# Patient Record
Sex: Female | Born: 1960 | Hispanic: Yes | Marital: Married | Smoking: Never smoker
Health system: Southern US, Community
[De-identification: ages and names within clinical notes are randomized; demographics above are authoritative.]

## PROBLEM LIST (undated history)

## (undated) ENCOUNTER — Ambulatory Visit: Admission: EM | Source: Home / Self Care

## (undated) DIAGNOSIS — M1990 Unspecified osteoarthritis, unspecified site: Secondary | ICD-10-CM

## (undated) HISTORY — PX: BREAST BIOPSY: SHX20

## (undated) HISTORY — PX: INDUCED ABORTION: SHX677

---

## 2005-02-26 ENCOUNTER — Ambulatory Visit: Payer: Self-pay | Admitting: Family Medicine

## 2005-04-25 ENCOUNTER — Ambulatory Visit: Payer: Self-pay | Admitting: Internal Medicine

## 2006-04-03 ENCOUNTER — Ambulatory Visit: Payer: Self-pay | Admitting: Family Medicine

## 2006-08-21 ENCOUNTER — Ambulatory Visit: Payer: Self-pay | Admitting: Certified Nurse Midwife

## 2006-09-18 ENCOUNTER — Ambulatory Visit: Payer: Self-pay | Admitting: General Surgery

## 2008-02-26 ENCOUNTER — Ambulatory Visit: Payer: Self-pay

## 2008-12-16 ENCOUNTER — Ambulatory Visit: Payer: Self-pay

## 2011-07-02 ENCOUNTER — Ambulatory Visit: Payer: Self-pay

## 2012-07-09 ENCOUNTER — Ambulatory Visit: Payer: Self-pay

## 2012-07-15 ENCOUNTER — Ambulatory Visit: Payer: Self-pay

## 2012-09-01 ENCOUNTER — Ambulatory Visit: Payer: Self-pay | Admitting: Surgery

## 2012-09-03 LAB — PATHOLOGY REPORT

## 2016-09-17 HISTORY — PX: BREAST BIOPSY: SHX20

## 2017-03-13 DIAGNOSIS — E782 Mixed hyperlipidemia: Secondary | ICD-10-CM | POA: Insufficient documentation

## 2017-03-13 DIAGNOSIS — M0579 Rheumatoid arthritis with rheumatoid factor of multiple sites without organ or systems involvement: Secondary | ICD-10-CM | POA: Insufficient documentation

## 2017-05-22 ENCOUNTER — Other Ambulatory Visit: Payer: Self-pay | Admitting: Certified Nurse Midwife

## 2017-05-22 DIAGNOSIS — Z1231 Encounter for screening mammogram for malignant neoplasm of breast: Secondary | ICD-10-CM

## 2017-07-02 ENCOUNTER — Ambulatory Visit
Admission: RE | Admit: 2017-07-02 | Discharge: 2017-07-02 | Disposition: A | Discharge status: Home / Self Care | Payer: BLUE CROSS/BLUE SHIELD | Source: Ambulatory Visit | Attending: Certified Nurse Midwife | Admitting: Certified Nurse Midwife

## 2017-07-02 DIAGNOSIS — Z1231 Encounter for screening mammogram for malignant neoplasm of breast: Secondary | ICD-10-CM | POA: Insufficient documentation

## 2018-02-07 ENCOUNTER — Ambulatory Visit
Admission: EM | Admit: 2018-02-07 | Discharge: 2018-02-07 | Disposition: A | Discharge status: Home / Self Care | Payer: BLUE CROSS/BLUE SHIELD

## 2018-02-07 ENCOUNTER — Encounter: Payer: Self-pay | Admitting: Emergency Medicine

## 2018-02-07 ENCOUNTER — Other Ambulatory Visit: Payer: Self-pay

## 2018-02-07 DIAGNOSIS — J0100 Acute maxillary sinusitis, unspecified: Secondary | ICD-10-CM | POA: Diagnosis not present

## 2018-02-07 DIAGNOSIS — M5412 Radiculopathy, cervical region: Secondary | ICD-10-CM | POA: Diagnosis not present

## 2018-02-07 HISTORY — DX: Unspecified osteoarthritis, unspecified site: M19.90

## 2018-02-07 MED ORDER — METAXALONE 800 MG PO TABS
800.0000 mg | ORAL_TABLET | Freq: Three times a day (TID) | ORAL | 0 refills | Status: DC
Start: 2018-02-07 — End: 2019-01-27

## 2018-02-07 MED ORDER — AMOXICILLIN-POT CLAVULANATE 875-125 MG PO TABS: 1.0000 | ORAL_TABLET | Freq: Two times a day (BID) | ORAL | 0 refills | Status: DC

## 2018-02-07 NOTE — ED Provider Notes (Signed)
[facility]    CSN: [id number] Arrival date & time: 02/07/18  1510     History   Chief Complaint Chief Complaint  Patient presents with  . Neck Pain  . Sinus Problem    HPI Kathy Gould is a 57 y.o. female.   HPI  -year-old Hispanic female presents with neck and shoulder pain with radicular symptoms into her upper extremities bilateral but not involving her hands or fingers.  Her main reason for coming today however was because of sinus congestion headache nausea and cough.  He did last month at [facility] primary for an upper respiratory infection.  She was given fluticasone which she is been using 2 sprays twice daily.  She states it is not helped.  She has pain in the maxillary sinus area.  All told she has had the pain for about 3 weeks.          Past Medical History:  Diagnosis Date  . Arthritis     There are no active problems to display for this patient.   Past Surgical History:  Procedure Laterality Date  . BREAST BIOPSY Left 09/17/2016   left bx BENIGN FIBROEPITHELIAL LESION, FAVOR FIBROADENOMA WITH USUAL   . BREAST BIOPSY Right    2 bx clips     OB History   None      Home Medications    Prior to Admission medications   Medication Sig Start Date End Date Taking? Authorizing Provider  sulfaSALAzine (AZULFIDINE) 500 MG tablet Take 1,500 mg by mouth 2 (two) times daily. 01/20/18  Yes [provider]  amoxicillin-clavulanate (AUGMENTIN) 875-125 MG tablet Take 1 tablet by mouth every 12 (twelve) hours. 02/07/18   Lutricia Feil, PA-C  metaxalone (SKELAXIN) 800 MG tablet Take 1 tablet (800 mg total) by mouth 3 (three) times daily. 02/07/18   Lutricia Feil, PA-C    Family History History reviewed. No pertinent family history.  Social History Social History   Tobacco Use  . Smoking status: Never Smoker  . Smokeless tobacco: Never Used  Substance Use Topics  . Alcohol use: Never    Frequency: Never  . Drug use:  Never     Allergies   Patient has no allergy information on record.   Review of Systems Review of Systems  Constitutional: Positive for activity change and fatigue. Negative for chills and fever.  HENT: Positive for congestion, postnasal drip, rhinorrhea, sinus pressure and sinus pain.   Musculoskeletal: Positive for arthralgias, myalgias and neck pain.  All other systems reviewed and are negative.    Physical Exam Triage Vital Signs ED Triage Vitals  Enc Vitals Group     BP 02/07/18 1622 124/77     Pulse Rate 02/07/18 1622 73     Resp 02/07/18 1622 16     Temp 02/07/18 1622 98.3 F (36.8 C)     Temp Source 02/07/18 1622 Oral     SpO2 02/07/18 1622 97 %     Weight 02/07/18 1619 138 lb (62.6 kg)     Height 02/07/18 1619 5\' 2"  (1.575 m)     Head Circumference --      Peak Flow --      Pain Score 02/07/18 1618 7     Pain Loc --      Pain Edu? --      Excl. in GC? --    No data found.  Updated Vital Signs BP 124/77 (BP Location: Right Arm)   Pulse 73  Temp 98.3 F (36.8 C) (Oral)   Resp 16   Ht 5\' 2"  (1.575 m)   Wt 138 lb (62.6 kg)   SpO2 97%   BMI 25.24 kg/m   Visual Acuity Right Eye Distance:   Left Eye Distance:   Bilateral Distance:    Right Eye Near:   Left Eye Near:    Bilateral Near:     Physical Exam  Constitutional: She is oriented to person, place, and time. She appears well-developed and well-nourished. No distress.  HENT:  Head: Normocephalic.  Right Ear: External ear normal.  Left Ear: External ear normal.  Nose: Nose normal.  Mouth/Throat: Oropharynx is clear and moist. No oropharyngeal exudate.  She has tenderness to percussion over the maxillary sinuses  Eyes: Pupils are equal, round, and reactive to light. Right eye exhibits no discharge. Left eye exhibits no discharge.  Neck: Normal range of motion.  Pulmonary/Chest: Effort normal and breath sounds normal.  Musculoskeletal: Normal range of motion. She exhibits tenderness.  Has  tenderness and tightness of the trapezii bilaterally reproducing her symptoms  Lymphadenopathy:    She has no cervical adenopathy.  Neurological: She is alert and oriented to person, place, and time.  Skin: Skin is warm and dry. She is not diaphoretic.  Psychiatric: She has a normal mood and affect. Her behavior is normal. Judgment and thought content normal.  Nursing note and vitals reviewed.    UC Treatments / Results  Labs (all labs ordered are listed, but only abnormal results are displayed) Labs Reviewed - No data to display  EKG None  Radiology No results found.  Procedures Procedures (including critical care time)  Medications Ordered in UC Medications - No data to display  Initial Impression / Assessment and Plan / UC Course  I have reviewed the triage vital signs and the nursing notes.  Pertinent labs & imaging results that were available during my care of the patient were reviewed by me and considered in my medical decision making (see chart for details).     Plan: 1. Test/x-ray results and diagnosis reviewed with patient 2. rx as per orders; risks, benefits, potential side effects reviewed with patient 3. Recommend supportive treatment with decreasing Flonase to 1 time daily.  The length of time that she has had her symptoms as well as the tenderness over the maxillary sinuses I will start her on Augmentin.  Trapezial tenderness and the radicular symptoms that she is been having I will start her on Skelaxin.  She is cautioned regarding the use of muscle relaxers .  Is not improving she should follow-up with her primary care physician at [facility] primary 4. F/u prn if symptoms worsen or don't improve  Final Clinical Impressions(s) / UC Diagnoses   Final diagnoses:  Cervical radiculopathy  Acute maxillary sinusitis, recurrence not specified     Discharge Instructions     Decrease the use of Flonase to once daily.Use  Caution while taking muscle relaxers.  Do not  perform activities requiring concentration or judgment and do not drive.     ED Prescriptions    Medication Sig Dispense Auth. Provider   metaxalone (SKELAXIN) 800 MG tablet Take 1 tablet (800 mg total) by mouth 3 (three) times daily. 21 tablet [facility], PA-C   amoxicillin-clavulanate (AUGMENTIN) 875-125 MG tablet Take 1 tablet by mouth every 12 (twelve) hours. 20 tablet Ovid Curd, PA-C     Controlled Substance Prescriptions  Controlled Substance Registry consulted? Not Applicable   Lutricia Feil,  Chrissie Noa, PA-C 02/07/18 1709

## 2018-02-07 NOTE — ED Triage Notes (Signed)
Pt c/o neck and shoulder pain. Also has sinus congestion, headache, nausea and cough. She was treated with a antibiotic but she can not remember the name but she is not feeling any better. She has been taking tylenol for the pain.

## 2018-02-07 NOTE — Discharge Instructions (Signed)
Decrease the use of Flonase to once daily.Use  Caution while taking muscle relaxers.  Do not perform activities requiring concentration or judgment and do not drive.

## 2018-06-11 ENCOUNTER — Other Ambulatory Visit: Payer: Self-pay | Admitting: Certified Nurse Midwife

## 2018-06-11 DIAGNOSIS — Z1231 Encounter for screening mammogram for malignant neoplasm of breast: Secondary | ICD-10-CM

## 2019-01-27 ENCOUNTER — Other Ambulatory Visit: Payer: Self-pay

## 2019-01-27 ENCOUNTER — Ambulatory Visit
Admission: EM | Admit: 2019-01-27 | Discharge: 2019-01-27 | Disposition: A | Discharge status: Home / Self Care | Payer: BC Managed Care – PPO

## 2019-01-27 DIAGNOSIS — L237 Allergic contact dermatitis due to plants, except food: Secondary | ICD-10-CM | POA: Diagnosis not present

## 2019-01-27 MED ORDER — DIPHENHYDRAMINE-ZINC ACETATE 2-0.1 % EX CREA: 1.0000 "application " | TOPICAL_CREAM | Freq: Three times a day (TID) | CUTANEOUS | 0 refills | Status: DC | PRN

## 2019-01-27 MED ORDER — PREDNISONE 10 MG (21) PO TBPK: ORAL_TABLET | Freq: Every day | ORAL | 0 refills | Status: DC

## 2019-01-27 NOTE — ED Triage Notes (Signed)
Patient complains of poison oak that started on Saturday while cutting the grass, originally on arm but has spread to face.

## 2019-01-27 NOTE — ED Provider Notes (Signed)
[place], [place]   Name: Manuella Blackson DOB: [birth year] MRN: [id number] CSN: [id number] PCP: Patient, No Pcp Per  Arrival date and time:  01/27/19 1007  Chief Complaint:  Poison Ivy   NOTE: Prior to seeing the patient today, I have reviewed the triage nursing documentation and vital signs. Clinical staff has updated patient's PMH/PSHx, current medication list, and drug allergies/intolerances to ensure comprehensive history available to assist in medical decision making.   History:   HPI: Jalisa Kari Baars is a 58 y.o. female who presents today with complaints of rash to her above eyes, arms, check, and LEFT flank that initially declared 3 days ago. Rash started on her arms and has progressively spread. Patient advising that she was working outside and inadvertently came into contact with poison oak. Rash is erythematous and pruritic. She has not appreciated any drainage associated with the rash. There is facial involvement; rash above BILATERAL eyes. There is no rash to periorbital, paranasal, or perioral areas. Patient denies that she is not experiencing any shortness of breath or sensation of pharyngeal/laryngeal fullness. Despite her symptoms, patient has not used any OTC interventions to help reduce/relieve the associated pruritis.  Past Medical History:  Diagnosis Date  . Arthritis     Past Surgical History:  Procedure Laterality Date  . BREAST BIOPSY Left 09/17/2016   left bx BENIGN FIBROEPITHELIAL LESION, FAVOR FIBROADENOMA WITH USUAL   . BREAST BIOPSY Right    2 bx clips     History reviewed. No pertinent family history.  Social History   Tobacco Use  . Smoking status: Never Smoker  . Smokeless tobacco: Never Used  Substance Use Topics  . Alcohol use: Never    Frequency: Never  . Drug use: Never    There are no active problems to display for this patient.   Home Medications:    Current Meds  Medication Sig  . sulfaSALAzine (AZULFIDINE) 500 MG  tablet Take by mouth.    Allergies:   Patient has no known allergies.  Review of Systems (ROS): Review of Systems  Constitutional: Negative for chills and fever.  HENT: Negative for drooling and trouble swallowing.   Respiratory: Negative for cough and shortness of breath.   Cardiovascular: Negative for chest pain and palpitations.  Skin: Positive for color change and rash.  All other systems reviewed and are negative.    Vital Signs: Today's Vitals   01/27/19 1018 01/27/19 1021  BP:  128/72  Pulse:  73  Resp:  16  Temp:  98.3 F (36.8 C)  TempSrc:  Oral  SpO2:  96%  Weight: 148 lb (67.1 kg)   Height: 5\' 6"  (1.676 m)   PainSc: 0-No pain     Physical Exam: Physical Exam  Constitutional: She is oriented to person, place, and time and well-developed, well-nourished, and in no distress.  HENT:  Head: Normocephalic and atraumatic.  Mouth/Throat: Oropharynx is clear and moist and mucous membranes are normal. No posterior oropharyngeal edema or posterior oropharyngeal erythema.  Neck: Normal range of motion. Neck supple. Tracheal deviation present.  Cardiovascular: Normal rate, regular rhythm, normal heart sounds and intact distal pulses. Exam reveals no gallop and no friction rub.  No murmur heard. Pulmonary/Chest: Effort normal and breath sounds normal. No respiratory distress. She has no wheezes. She has no rales.  Lymphadenopathy:    She has no cervical adenopathy.  Neurological: She is alert and oriented to person, place, and time. Gait normal. GCS score is 15.  Skin: Skin is  warm and dry. Rash noted. Rash is maculopapular (above eyes, arms, chest, LEFT flank).  Psychiatric: Mood, memory, affect and judgment normal.  Nursing note and vitals reviewed.   Urgent Care Treatments / Results:   LABS: PLEASE NOTE: all labs that were ordered this encounter are listed, however only abnormal results are displayed. Labs Reviewed - No data to display  EKG: -None  RADIOLOGY:  No results found.  PROCEDURES: Procedures  MEDICATIONS RECEIVED THIS VISIT: Medications - No data to display  PERTINENT CLINICAL COURSE NOTES/UPDATES:   Initial Impression / Assessment and Plan / Urgent Care Course:  Pertinent labs & imaging results that were available during my care of the patient were personally reviewed by me and considered in my medical decision making (see lab/imaging section of note for values and interpretations).  Meeka [place] Alita Chyle is a 58 y.o. female who presents to [facility] today with complaints of rash related to exposure to poison oak.   Patient is well appearing overall in clinic today. She does not appear to be in any acute distress. Presenting symptoms (see HPI) and exam as documented above. Presenting symptoms consistent with reported exposure to poison oak. She denies any shortness of breath or difficulties swallowing. Will prescribe a steroid taper and encouraged use of oral diphenhydramine at night. Patient will be given topical diphenhydramine to use during the day.   Discussed follow up with primary care physician in 1 week for re-evaluation. I have reviewed the follow up and strict return precautions for any new or worsening symptoms. Patient is aware of symptoms that would be deemed urgent/emergent, and would thus require further evaluation either here or in the emergency department. At the time of discharge, she verbalized understanding and consent with the discharge plan as it was reviewed with her. All questions were fielded by provider and/or clinic staff prior to patient discharge.    Final Clinical Impressions / Urgent Care Diagnoses:   Final diagnoses:  Contact dermatitis due to poison oak    New Prescriptions:  [place] Controlled Substance Registry consulted? Not Applicable  Meds ordered this encounter  Medications  . predniSONE (STERAPRED UNI-PAK 21 TAB) 10 MG (21) TBPK tablet    Sig: Take by mouth daily. 60 mg x 1 day, 50 mg  x 1 day, 40 mg x 1 day, 30 mg x 1 day, 20 mg x 1 day, 10 mg x 1 day    Dispense:  21 tablet    Refill:  0  . diphenhydrAMINE-zinc acetate (BENADRYL EXTRA STRENGTH) cream    Sig: Apply 1 application topically 3 (three) times daily as needed for itching.    Dispense:  28.4 g    Refill:  0    Recommended Follow up Care:  Patient encouraged to follow up with the following provider within the specified time frame, or sooner as dictated by the severity of her symptoms. As always, she was instructed that for any urgent/emergent care needs, she should seek care either here or in the emergency department for more immediate evaluation.  Follow-up Information    PCP In 1 week.   Why: General reassessment of symptoms if not improving        NOTE: This note was prepared using Scientist, clinical (histocompatibility and immunogenetics) along with smaller Lobbyist. Despite my best ability to proofread, there is the potential that transcriptional errors may still occur from this process, and are completely unintentional.    Verlee Monte, NP 01/27/19 1046

## 2019-01-27 NOTE — Discharge Instructions (Signed)
It was very nice seeing you today in clinic. Thank you for entrusting me with your care.   Avoid scratching.  Please utilize the medications that we e discussed. Your prescriptions have been called in to your pharmacy.  May take oral Benadryl at night to help with itching During the day, you can use the Benadryl cream that I sent in for you.   Make arrangements to follow up with your regular doctor in 1 week for re-evaluation if not improving. If your symptoms/condition worsens, please seek follow up care either here or in the ER. Please remember, our [facility] providers are "right here with you" when you need [place].   Again, it was my pleasure to take care of you today. Thank you for choosing our clinic. I hope that you start to feel better quickly.   Honor Loh, MSN, APRN, FNP-C, CEN Advanced Practice Provider [facility]

## 2019-06-30 ENCOUNTER — Other Ambulatory Visit: Payer: Self-pay | Admitting: Certified Nurse Midwife

## 2019-06-30 DIAGNOSIS — Z1231 Encounter for screening mammogram for malignant neoplasm of breast: Secondary | ICD-10-CM

## 2019-09-09 IMAGING — MG MM DIGITAL SCREENING BILAT W/ CAD
5 series · 5 of 5 positions shown · non-contrast
Comparison: Previous exam(s).

CLINICAL DATA: Screening.

EXAM:
DIGITAL SCREENING BILATERAL MAMMOGRAM WITH CAD

[R CC]
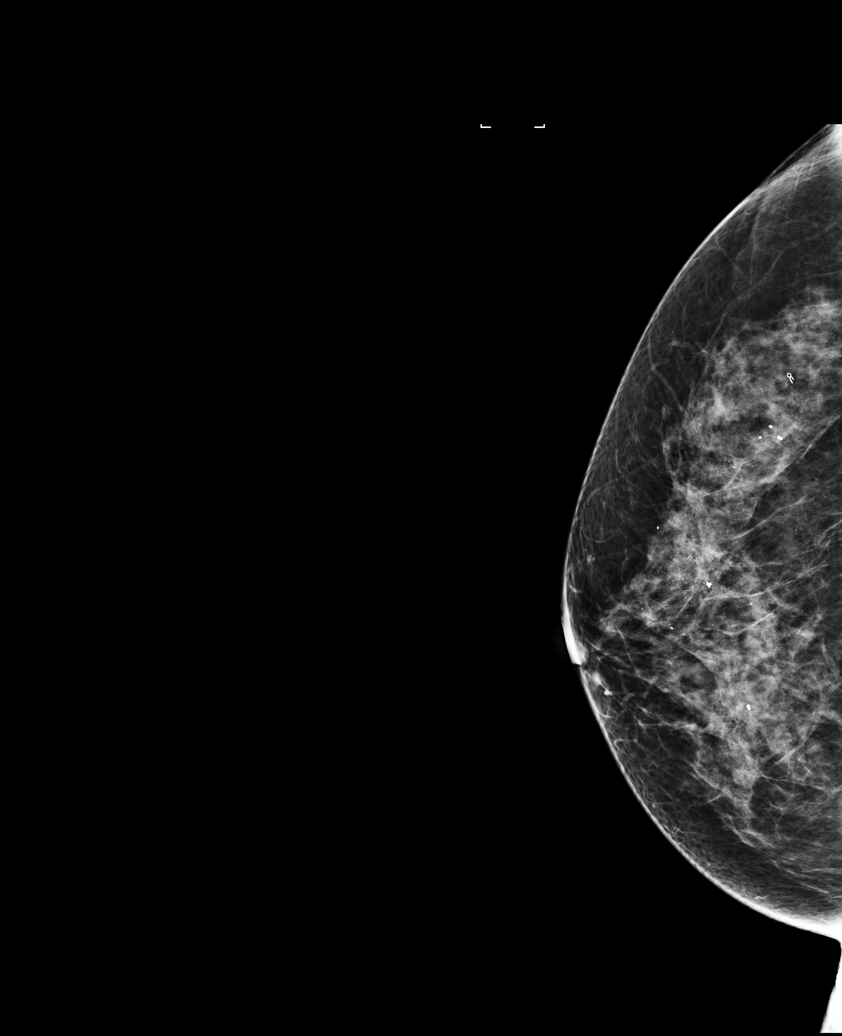

[R MLO]
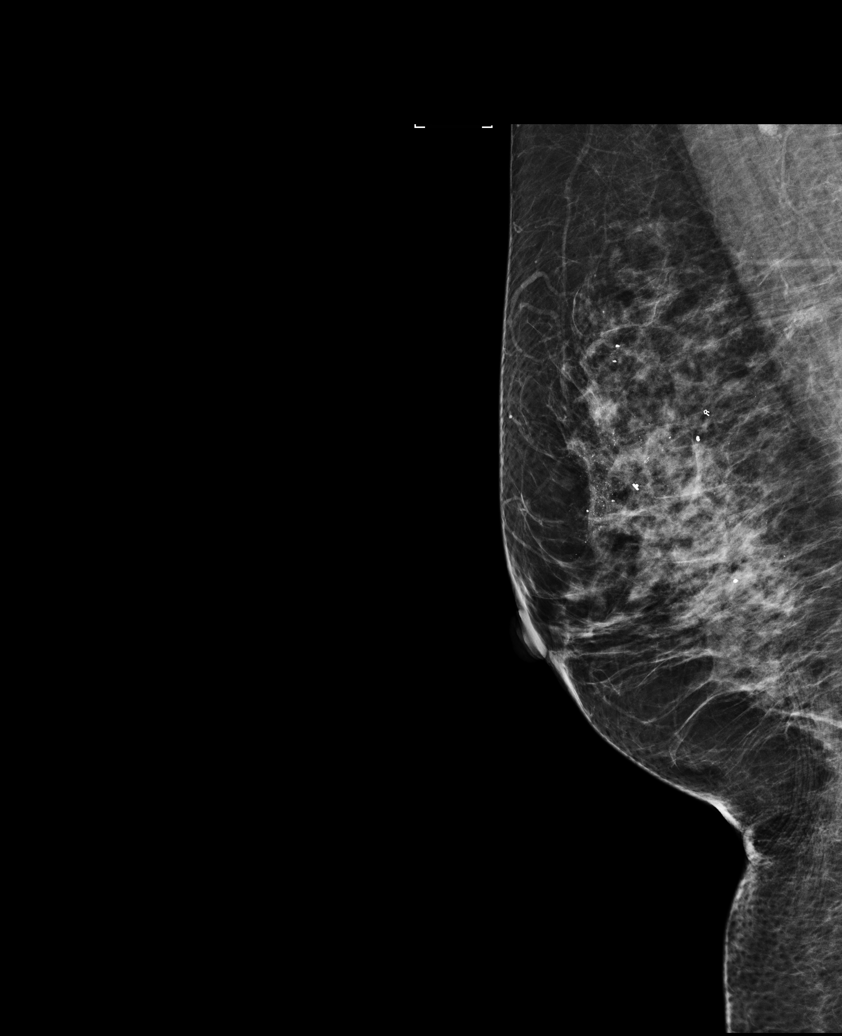

[L CC]
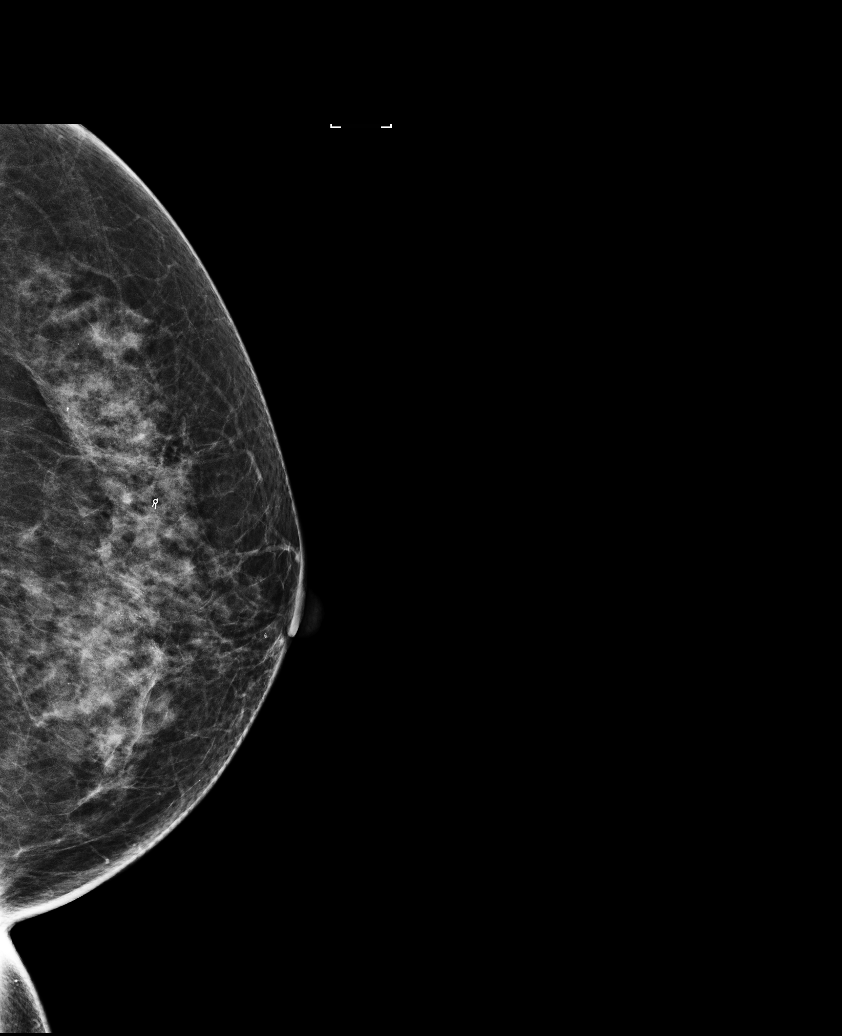

[L MLO (1 of 2)]
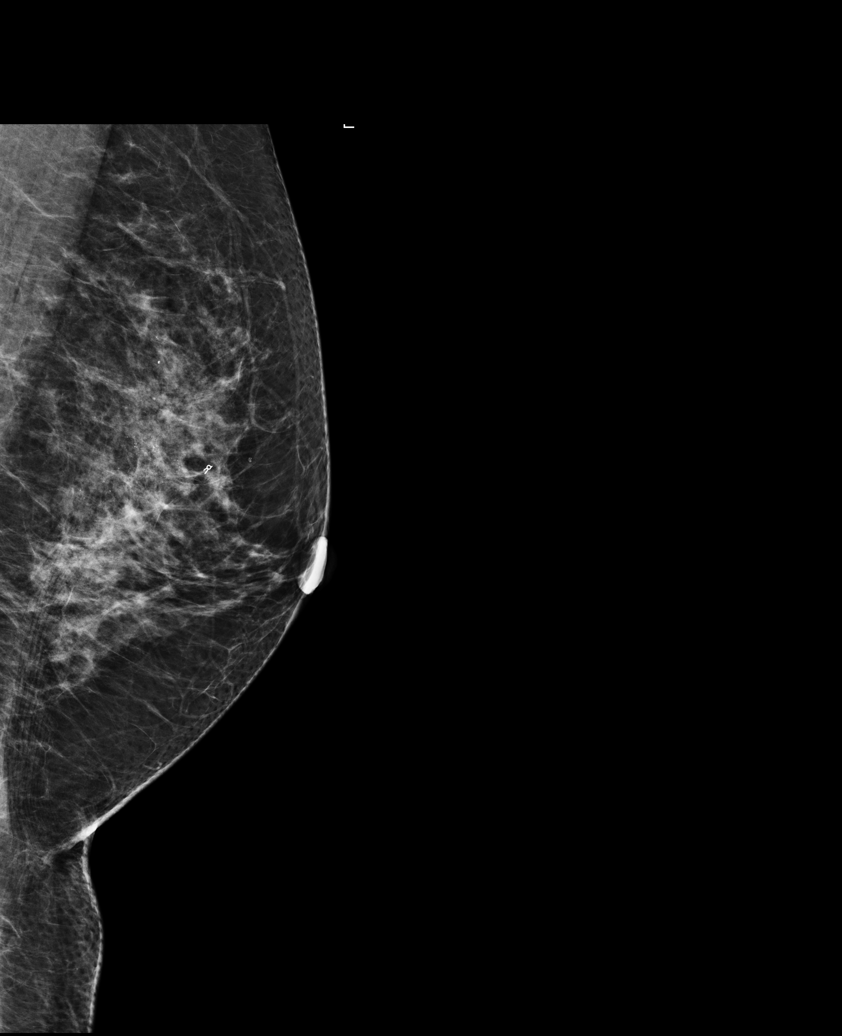

[L MLO (2 of 2)]
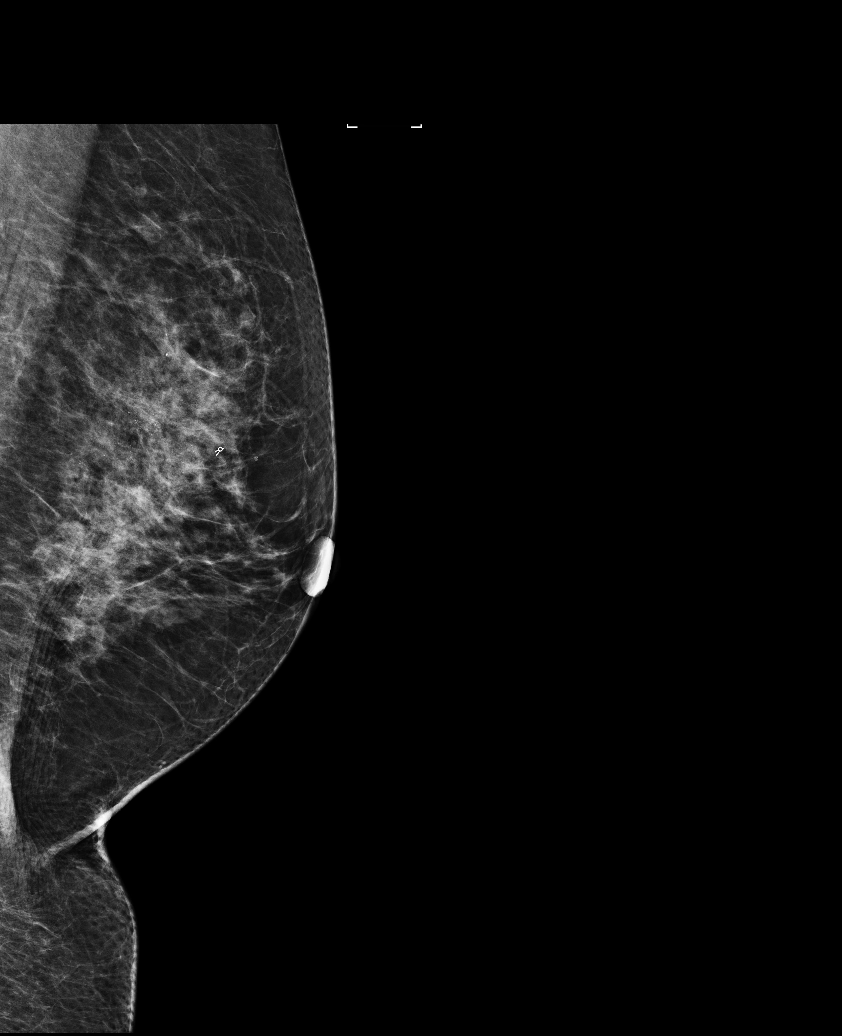

[5 of 5 positions shown; findings below may reference images not displayed]

ACR Breast Density Category c: The breast tissue is heterogeneously
dense, which may obscure small masses.
FINDINGS: There are no findings suspicious for malignancy. Images were
processed with CAD.
IMPRESSION: No mammographic evidence of malignancy. A result letter of this
screening mammogram will be mailed directly to the patient.

RECOMMENDATION:
Screening mammogram in one year. (Code:[id number])

BI-RADS CATEGORY  1: Negative.

## 2019-09-11 ENCOUNTER — Ambulatory Visit: Payer: Self-pay | Attending: Oncology

## 2019-09-11 DIAGNOSIS — Z23 Encounter for immunization: Secondary | ICD-10-CM

## 2019-09-11 NOTE — Progress Notes (Signed)
   Covid-19 Vaccination Clinic  Name:  Kathy Gould    MRN: [id number] DOB: [birth year]  09/11/2019  Ms. Kathy Gould was observed post Covid-19 immunization for 15 minutes without incident. She was provided with Vaccine Information Sheet and instruction to access the V-Safe system.   Ms. Kathy Gould was instructed to call 911 with any severe reactions post vaccine: Marland Kitchen Difficulty breathing  . Swelling of face and throat  . A fast heartbeat  . A bad rash all over body  . Dizziness and weakness   Immunizations Administered    Name Date Dose VIS Date Route   Pfizer COVID-19 Vaccine 09/11/2019 10:00 AM 0.3 mL 05/15/2019 Intramuscular   Manufacturer: [employer], [employer]   Lot: [id number]   NDC: [address]-4

## 2019-10-06 ENCOUNTER — Ambulatory Visit: Payer: Self-pay | Attending: Internal Medicine

## 2019-10-06 DIAGNOSIS — Z23 Encounter for immunization: Secondary | ICD-10-CM

## 2019-10-06 NOTE — Progress Notes (Signed)
   Covid-19 Vaccination Clinic  Name:  Kathy Gould    MRN: [id number] DOB: 20-Nov-1960  10/06/2019  Kathy Gould was observed post Covid-19 immunization for 15 minutes without incident. She was provided with Vaccine Information Sheet and instruction to access the V-Safe system.   Kathy Gould was instructed to call 911 with any severe reactions post vaccine: Marland Kitchen Difficulty breathing  . Swelling of face and throat  . A fast heartbeat  . A bad rash all over body  . Dizziness and weakness   Immunizations Administered    Name Date Dose VIS Date Route   Pfizer COVID-19 Vaccine 10/06/2019  3:53 PM 0.3 mL 07/29/2018 Intramuscular   Manufacturer: [employer], [employer]   Lot: [id number]   NDC: [address]-2

## 2019-11-21 ENCOUNTER — Other Ambulatory Visit: Payer: Self-pay

## 2019-11-21 ENCOUNTER — Ambulatory Visit
Admission: EM | Admit: 2019-11-21 | Discharge: 2019-11-21 | Disposition: A | Discharge status: Home / Self Care | Payer: Self-pay | Attending: Internal Medicine | Admitting: Internal Medicine

## 2019-11-21 DIAGNOSIS — L239 Allergic contact dermatitis, unspecified cause: Secondary | ICD-10-CM

## 2019-11-21 MED ORDER — PREDNISONE 20 MG PO TABS: 20.0000 mg | ORAL_TABLET | Freq: Every day | ORAL | 0 refills | Status: AC

## 2019-11-21 MED ORDER — HYDROXYZINE HCL 25 MG PO TABS: 25.0000 mg | ORAL_TABLET | Freq: Four times a day (QID) | ORAL | 0 refills | Status: DC

## 2019-11-21 NOTE — ED Triage Notes (Signed)
Patient complains of left side of face swelling x Thursday. Patient states that area is slightly itchy with redness.

## 2019-11-22 NOTE — ED Provider Notes (Signed)
[facility]    CSN: [id number] Arrival date & time: 11/21/19  1227      History   Chief Complaint Chief Complaint  Patient presents with  . Facial Swelling    left side    HPI Kathy Gould is a 59 y.o. female comes to the urgent care with 1 day history of itchy swelling in the left infraorbital area.  Symptoms started 3 days ago.  She has been doing some yard work and has poison ivy in the area backyard.  She believes that she is coming to contact with some poison ivy.  She also has some of the itchy rash on the left upper extremity.  No fever or chills.  No problems with her vision.  No blurry vision or pain on moving eyeball.   HPI  Past Medical History:  Diagnosis Date  . Arthritis     There are no problems to display for this patient.   Past Surgical History:  Procedure Laterality Date  . BREAST BIOPSY Left 09/17/2016   left bx BENIGN FIBROEPITHELIAL LESION, FAVOR FIBROADENOMA WITH USUAL   . BREAST BIOPSY Right    2 bx clips     OB History   No obstetric history on file.      Home Medications    Prior to Admission medications   Medication Sig Start Date End Date Taking? Authorizing Provider  sulfaSALAzine (AZULFIDINE) 500 MG tablet Take by mouth. 03/20/18  Yes [provider]  hydrOXYzine (ATARAX/VISTARIL) 25 MG tablet Take 1 tablet (25 mg total) by mouth every 6 (six) hours. 11/21/19   Tavi Hoogendoorn, Britta Mccreedy, MD  predniSONE (DELTASONE) 20 MG tablet Take 1 tablet (20 mg total) by mouth daily for 3 days. 11/21/19 11/24/19  Merrilee Jansky, MD    Family History History reviewed. No pertinent family history.  Social History Social History   Tobacco Use  . Smoking status: Never Smoker  . Smokeless tobacco: Never Used  Vaping Use  . Vaping Use: Never used  Substance Use Topics  . Alcohol use: Never  . Drug use: Never     Allergies   Patient has no known allergies.   Review of Systems Review of Systems    Constitutional: Negative.   HENT: Negative.   Eyes: Negative for photophobia, pain, discharge, redness, itching and visual disturbance.  Respiratory: Negative.   Cardiovascular: Negative.   Gastrointestinal: Negative.   Musculoskeletal: Negative.      Physical Exam Triage Vital Signs ED Triage Vitals  Enc Vitals Group     BP 11/21/19 1248 (!) 124/57     Pulse Rate 11/21/19 1248 77     Resp 11/21/19 1248 18     Temp 11/21/19 1248 98.3 F (36.8 C)     Temp Source 11/21/19 1248 Oral     SpO2 11/21/19 1248 97 %     Weight 11/21/19 1247 146 lb (66.2 kg)     Height 11/21/19 1247 5\' 6"  (1.676 m)     Head Circumference --      Peak Flow --      Pain Score 11/21/19 1246 0     Pain Loc --      Pain Edu? --      Excl. in GC? --    No data found.  Updated Vital Signs BP (!) 124/57 (BP Location: Right Arm)   Pulse 77   Temp 98.3 F (36.8 C) (Oral)   Resp 18   Ht 5\' 6"  (1.676 m)  Wt 66.2 kg   SpO2 97%   BMI 23.57 kg/m   Visual Acuity Right Eye Distance:   Left Eye Distance:   Bilateral Distance:    Right Eye Near:   Left Eye Near:    Bilateral Near:     Physical Exam Vitals and nursing note reviewed.  Constitutional:      General: She is not in acute distress.    Appearance: She is not ill-appearing.  Eyes:     Extraocular Movements: Extraocular movements intact.     Pupils: Pupils are equal, round, and reactive to light.     Comments: Mildly erythematous rash with excoriation over the left lower eyelid.  Wrist mild swelling over the area.  No conjunctival erythema.  Left upper eyelid is without any swelling, excoriation or erythema.  Pulmonary:     Effort: Pulmonary effort is normal.     Breath sounds: Normal breath sounds.  Skin:    General: Skin is warm.     Capillary Refill: Capillary refill takes less than 2 seconds.     Findings: No erythema or lesion.  Neurological:     Mental Status: She is alert.      UC Treatments / Results  Labs (all labs  ordered are listed, but only abnormal results are displayed) Labs Reviewed - No data to display  EKG   Radiology No results found.  Procedures Procedures (including critical care time)  Medications Ordered in UC Medications - No data to display  Initial Impression / Assessment and Plan / UC Course  I have reviewed the triage vital signs and the nursing notes.  Pertinent labs & imaging results that were available during my care of the patient were reviewed by me and considered in my medical decision making (see chart for details).     1.  Allergic contact dermatitis: Short course of prednisone Hydroxyzine as needed for itching Continue calamine lotion use If symptoms worsen, return to urgent care to be reevaluated If you notice any changes in your vision-return to urgent care to be reevaluated. Final Clinical Impressions(s) / UC Diagnoses   Final diagnoses:  Allergic contact dermatitis, unspecified trigger   Discharge Instructions   None    ED Prescriptions    Medication Sig Dispense Auth. Provider   predniSONE (DELTASONE) 20 MG tablet Take 1 tablet (20 mg total) by mouth daily for 3 days. 3 tablet Edessa Jakubowicz, Myrene Galas, MD   hydrOXYzine (ATARAX/VISTARIL) 25 MG tablet Take 1 tablet (25 mg total) by mouth every 6 (six) hours. 12 tablet Elodia Haviland, Myrene Galas, MD     PDMP not reviewed this encounter.   Chase Picket, MD 11/22/19 [phone]

## 2020-07-02 ENCOUNTER — Other Ambulatory Visit: Payer: Self-pay

## 2020-07-02 ENCOUNTER — Ambulatory Visit
Admission: EM | Admit: 2020-07-02 | Discharge: 2020-07-02 | Disposition: A | Discharge status: Home / Self Care | Payer: HRSA Program | Attending: Family Medicine | Admitting: Family Medicine

## 2020-07-02 DIAGNOSIS — B9789 Other viral agents as the cause of diseases classified elsewhere: Secondary | ICD-10-CM

## 2020-07-02 DIAGNOSIS — U071 COVID-19: Secondary | ICD-10-CM | POA: Diagnosis not present

## 2020-07-02 LAB — GROUP A STREP BY PCR: Group A Strep by PCR: NOT DETECTED

## 2020-07-02 MED ORDER — LIDOCAINE VISCOUS HCL 2 % MT SOLN: OROMUCOSAL | 0 refills | Status: DC

## 2020-07-02 MED ORDER — BENZONATATE 200 MG PO CAPS: 200.0000 mg | ORAL_CAPSULE | Freq: Three times a day (TID) | ORAL | 0 refills | Status: DC | PRN

## 2020-07-02 NOTE — Discharge Instructions (Signed)
Medication as prescribed.  Stay home.  Check my chart for COVID test results.  Take care  Dr. Jaxsyn Catalfamo   

## 2020-07-02 NOTE — ED Triage Notes (Signed)
Patient c/o cough and sore throat that started yesterday.  Patient unsure about fevers.

## 2020-07-03 LAB — SARS CORONAVIRUS 2 (TAT 6-24 HRS): SARS Coronavirus 2: POSITIVE — AB

## 2020-07-03 NOTE — ED Provider Notes (Signed)
[facility]    CSN: [id number] Arrival date & time: 07/02/20  1553      History   Chief Complaint Chief Complaint  Patient presents with  . Sore Throat  . Cough   HPI 60 year old female presents with the above complaints.  Started yesterday.  Reports cough and sore throat.  No reported fever.  Pain 7/10 in severity.  No relieving factors.  Needs Covid testing today.  No other complaints or concerns at this time.   Past Medical History:  Diagnosis Date  . Arthritis    Past Surgical History:  Procedure Laterality Date  . BREAST BIOPSY Left 09/17/2016   left bx BENIGN FIBROEPITHELIAL LESION, FAVOR FIBROADENOMA WITH USUAL   . BREAST BIOPSY Right    2 bx clips     OB History   No obstetric history on file.      Home Medications    Prior to Admission medications   Medication Sig Start Date End Date Taking? Authorizing Provider  benzonatate (TESSALON) 200 MG capsule Take 1 capsule (200 mg total) by mouth 3 (three) times daily as needed for cough. 07/02/20  Yes Blayde Bacigalupi G, DO  lidocaine (XYLOCAINE) 2 % solution Gargle 15 mL every 3 hours as needed. May swallow if desired. 07/02/20  Yes Tommie Sams, DO  sulfaSALAzine (AZULFIDINE) 500 MG tablet Take by mouth. 03/20/18  Yes [provider]    Social History Social History   Tobacco Use  . Smoking status: Never Smoker  . Smokeless tobacco: Never Used  Vaping Use  . Vaping Use: Never used  Substance Use Topics  . Alcohol use: Never  . Drug use: Never     Allergies   Patient has no known allergies.   Review of Systems Review of Systems  Constitutional: Negative for fever.  HENT: Positive for sore throat.   Respiratory: Positive for cough.    Physical Exam Triage Vital Signs ED Triage Vitals  Enc Vitals Group     BP 07/02/20 1601 134/64     Pulse Rate 07/02/20 1601 92     Resp 07/02/20 1601 14     Temp 07/02/20 1601 98.2 F (36.8 C)     Temp Source 07/02/20 1601 Oral     SpO2  07/02/20 1601 97 %     Weight 07/02/20 1558 145 lb 15.1 oz (66.2 kg)     Height 07/02/20 1558 5\' 4"  (1.626 m)     Head Circumference --      Peak Flow --      Pain Score 07/02/20 1557 7     Pain Loc --      Pain Edu? --      Excl. in GC? --    Updated Vital Signs BP 134/64 (BP Location: Right Arm)   Pulse 92   Temp 98.2 F (36.8 C) (Oral)   Resp 14   Ht 5\' 4"  (1.626 m)   Wt 66.2 kg   SpO2 97%   BMI 25.05 kg/m   Visual Acuity Right Eye Distance:   Left Eye Distance:   Bilateral Distance:    Right Eye Near:   Left Eye Near:    Bilateral Near:     Physical Exam Vitals and nursing note reviewed.  Constitutional:      General: She is not in acute distress.    Appearance: Normal appearance. She is not ill-appearing.  HENT:     Head: Normocephalic and atraumatic.     Mouth/Throat:  Pharynx: Posterior oropharyngeal erythema present. No oropharyngeal exudate.  Cardiovascular:     Rate and Rhythm: Normal rate and regular rhythm.     Heart sounds: No murmur heard.   Pulmonary:     Effort: Pulmonary effort is normal.     Breath sounds: Normal breath sounds. No wheezing, rhonchi or rales.  Neurological:     Mental Status: She is alert.  Psychiatric:        Mood and Affect: Mood normal.        Behavior: Behavior normal.    UC Treatments / Results  Labs (all labs ordered are listed, but only abnormal results are displayed) Labs Reviewed  SARS CORONAVIRUS 2 (TAT 6-24 HRS) - Abnormal; Notable for the following components:      Result Value   SARS Coronavirus 2 POSITIVE (*)    All other components within normal limits  GROUP A STREP BY PCR    EKG   Radiology No results found.  Procedures Procedures (including critical care time)  Medications Ordered in UC Medications - No data to display  Initial Impression / Assessment and Plan / UC Course  I have reviewed the triage vital signs and the nursing notes.  Pertinent labs & imaging results that were  available during my care of the patient were reviewed by me and considered in my medical decision making (see chart for details).    60 year old female presents with viral respiratory infection.  Covid positive.  Tessalon Perles and viscous lidocaine for symptomatic relief.  Work note given.  Final Clinical Impressions(s) / UC Diagnoses   Final diagnoses:  COVID     Discharge Instructions     Medication as prescribed.  Stay home.  Check my chart for COVID test results.  Take care  Dr. Adriana Simas      ED Prescriptions    Medication Sig Dispense Auth. Provider   lidocaine (XYLOCAINE) 2 % solution Gargle 15 mL every 3 hours as needed. May swallow if desired. 200 mL Deshonna Trnka G, DO   benzonatate (TESSALON) 200 MG capsule Take 1 capsule (200 mg total) by mouth 3 (three) times daily as needed for cough. 30 capsule Tommie Sams, DO     PDMP not reviewed this encounter.   Everlene Other [place], [place] 07/03/20 [phone]

## 2021-06-22 ENCOUNTER — Ambulatory Visit: Payer: BC Managed Care – PPO | Attending: Infectious Diseases | Admitting: Infectious Diseases

## 2021-06-22 ENCOUNTER — Other Ambulatory Visit: Payer: Self-pay

## 2021-06-22 VITALS — BP 112/75 | HR 74 | Temp 98.2°F | Resp 16 | Ht 64.0 in | Wt 145.0 lb

## 2021-06-22 DIAGNOSIS — E785 Hyperlipidemia, unspecified: Secondary | ICD-10-CM | POA: Insufficient documentation

## 2021-06-22 DIAGNOSIS — M069 Rheumatoid arthritis, unspecified: Secondary | ICD-10-CM | POA: Diagnosis not present

## 2021-06-22 DIAGNOSIS — Z227 Latent tuberculosis: Secondary | ICD-10-CM | POA: Diagnosis not present

## 2021-06-22 DIAGNOSIS — Z8611 Personal history of tuberculosis: Secondary | ICD-10-CM | POA: Diagnosis present

## 2021-06-22 DIAGNOSIS — R7612 Nonspecific reaction to cell mediated immunity measurement of gamma interferon antigen response without active tuberculosis: Secondary | ICD-10-CM | POA: Diagnosis not present

## 2021-06-22 DIAGNOSIS — Z791 Long term (current) use of non-steroidal anti-inflammatories (NSAID): Secondary | ICD-10-CM | POA: Diagnosis not present

## 2021-06-22 NOTE — Patient Instructions (Addendum)
You are referred to me for positive quantiferon gold- you have a history of TB exposure at the age of 19yr in [place] and you were treated you say .  I will try to get the records from Health department- I will go check your CXR You may need another PPD, which I will decide after getting these records-

## 2021-06-22 NOTE — Progress Notes (Signed)
NAME: Kathy Gould  DOB: 07-11-1960  MRN: [id number]  Date/Time: 06/22/2021 9:06 AM   Subjective:   Kathy Gould is a 61 y.o. with a history rheumatoid arthritis, HLD is referred by Dr.Kernodle for positive quantiferon gold- She has rheumatoid arthritis with positive anti CCP antibodies. She is currently on sulfasalazine and the plan is to add methotrexate and a biologic later- As part of the work up Quantiferon gold was done to look for TB exposure and it came back positive . Pt says she  Came from [place] to [place] and had TB exposure and was treated 40 yrs ago She has no cough, sob, night sweats, fever, weight loss She works at  [employer]  Past Medical History:  Diagnosis Date   Arthritis    Rheumatoid arthritis Hyperlipidemia  Past Surgical History:  Procedure Laterality Date   BREAST BIOPSY Left 09/17/2016   left bx BENIGN FIBROEPITHELIAL LESION, FAVOR FIBROADENOMA WITH USUAL    BREAST BIOPSY Right    2 bx clips     Social History   Socioeconomic History   Marital status: Married    Spouse name: Not on file   Number of children: Not on file   Years of education: Not on file   Highest education level: Not on file  Occupational History   Not on file  Tobacco Use   Smoking status: Never   Smokeless tobacco: Never  Vaping Use   Vaping Use: Never used  Substance and Sexual Activity   Alcohol use: Never   Drug use: Never   Sexual activity: Not on file  Other Topics Concern   Not on file  Social History Narrative   Not on file   Social Determinants of Health   Financial Resource Strain: Not on file  Food Insecurity: Not on file  Transportation Needs: Not on file  Physical Activity: Not on file  Stress: Not on file  Social Connections: Not on file  Intimate Partner Violence: Not on file     No Known Allergies I?  Medication'sulfasalazine  FH- none she says   Abtx:  Anti-infectives (From admission, onward)    None        REVIEW OF SYSTEMS:  Const: negative fever, negative chills, negative weight loss Eyes: negative diplopia or visual changes, negative eye pain ENT: negative coryza, negative sore throat Resp: negative cough, hemoptysis, dyspnea Cards: negative for chest pain, palpitations, lower extremity edema GU: negative for frequency, dysuria and hematuria GI: Negative for abdominal pain, diarrhea, bleeding, constipation Skin: negative for rash and pruritus Heme: negative for easy bruising and gum/nose bleeding MS: no pain in her hands- may have some stiffness but she says much beterr with sulfasalazine  Neurolo:negative for headaches, dizziness, vertigo, memory problems  Psych: negative for feelings of anxiety, depression  Endocrine: negative for thyroid, diabetes Allergy/Immunology- negative for any medication or food allergies ? Pertinent Positives include : Objective:  VITALS:  BP 112/75    Pulse 74    Temp 98.2 F (36.8 C) (Oral)    Resp 16    Ht '5\' 4"'  (1.626 m)    Wt 145 lb (65.8 kg)    SpO2 94%    BMI 24.89 kg/m   PHYSICAL EXAM:  General: Alert, cooperative, no distress, appears stated age.  Head: Normocephalic, without obvious abnormality, atraumatic. Eyes: Conjunctivae clear, anicteric sclerae. Pupils are equal ENT Nares normal. No drainage or sinus tenderness. Lips, mucosa, and tongue normal. No Thrush Neck: Supple, symmetrical, no  adenopathy, thyroid: non tender no carotid bruit and no JVD. Back: No CVA tenderness. Lungs: Clear to auscultation bilaterally. No Wheezing or Rhonchi. No rales. Heart: Regular rate and rhythm, no murmur, rub or gallop. Abdomen: Soft, non-tender,not distended. Bowel sounds normal. No masses Extremities: atraumatic, no cyanosis. No edema. No clubbing Skin: No rashes or lesions. Or bruising Lymph: Cervical, supraclavicular normal. Neurologic: Grossly non-focal Pertinent Labs Lab Results 05/15/21- HB 12.6, WBC 4.7, PLT 230, MCV 90 NA 141, K  3.8, cr 0.6, AST 23, ALT29, ALK 91, Alb 4.1, ESR 21 CCP -59   IMAGING RESULTS: None available ? 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 ?plan is to start MTX /biologics as per Dr.Kernodle ? ___________________________________________________ Discussed with patient, requesting provider Note:  This document was prepared using Dragon voice recognition software and may include unintentional dictation errors.

## 2021-06-27 ENCOUNTER — Telehealth: Payer: Self-pay

## 2021-06-27 NOTE — Telephone Encounter (Signed)
Called to discuss TB treatment and testing records that were done over 40 years ago at [facility], [facility]).  TB clinic [phone] [place] [address]   Med Records [phone] Auerbach     Both departments informed me they are legally bound to keep records for only 7 years Medical and 10 years Behavioral and then they destroy them.

## 2021-07-12 ENCOUNTER — Telehealth: Payer: Self-pay

## 2021-07-12 NOTE — Telephone Encounter (Signed)
I spoke to the patient with Kathy Gould and advise her that we were unable to obtain her medical records from [place] due to the amount of years for the records. Patient advised she will need PPD test CXR and sputum test per Dr. Rivka Safer. Patient verbalized understanding. Patient scheduled for 07/20/21.  Thanks   Interpreter: Mario/ID [phone]

## 2021-07-20 ENCOUNTER — Ambulatory Visit: Payer: BC Managed Care – PPO | Attending: Infectious Diseases | Admitting: Infectious Diseases

## 2021-07-20 ENCOUNTER — Ambulatory Visit
Admission: RE | Admit: 2021-07-20 | Discharge: 2021-07-20 | Disposition: A | Discharge status: Home / Self Care | Payer: BC Managed Care – PPO | Source: Ambulatory Visit | Attending: Infectious Diseases | Admitting: Infectious Diseases

## 2021-07-20 ENCOUNTER — Other Ambulatory Visit: Payer: Self-pay

## 2021-07-20 ENCOUNTER — Ambulatory Visit
Admission: RE | Admit: 2021-07-20 | Discharge: 2021-07-20 | Disposition: A | Discharge status: Home / Self Care | Payer: BC Managed Care – PPO | Attending: Infectious Diseases | Admitting: Infectious Diseases

## 2021-07-20 VITALS — BP 137/82 | HR 70 | Temp 98.0°F

## 2021-07-20 DIAGNOSIS — Z79899 Other long term (current) drug therapy: Secondary | ICD-10-CM | POA: Insufficient documentation

## 2021-07-20 DIAGNOSIS — Z227 Latent tuberculosis: Secondary | ICD-10-CM | POA: Diagnosis not present

## 2021-07-20 DIAGNOSIS — M069 Rheumatoid arthritis, unspecified: Secondary | ICD-10-CM | POA: Diagnosis not present

## 2021-07-20 DIAGNOSIS — E785 Hyperlipidemia, unspecified: Secondary | ICD-10-CM | POA: Diagnosis not present

## 2021-07-20 NOTE — Progress Notes (Signed)
NAME: Kathy Gould  DOB: 09-27-1960  MRN: [id number]  Date/Time: 07/20/2021 11:44 AM   Subjective:  Follow up visit , I last saw her on 06/22/21 Today she is here to discuss further about the positive quantiferon gold. Spanish interpretor is in the room Following is taken from last note [name] is a 61 y.o. with a history rheumatoid arthritis, HLD is referred by Dr.Kernodle for positive quantiferon gold- She has rheumatoid arthritis with positive anti CCP antibodies. She is currently on sulfasalazine and the plan is to add methotrexate and a biologic later- As part of the work up, Quantiferon gold was done to look for TB exposure ,and it came back positive . Pt says she came from [place] to [place] and had TB exposure and was treated 40 yrs ago.  [place] [facility] [place] did not have records from 40 yrs ago   Pt has no cough, sob, night sweats, fever, weight loss She is currently taking sulfasalazine and says she has no pain in her fingers/hands  Past Medical History:  Diagnosis Date   Arthritis    Rheumatoid arthritis Hyperlipidemia  Past Surgical History:  Procedure Laterality Date   BREAST BIOPSY Left 09/17/2016   left bx BENIGN FIBROEPITHELIAL LESION, FAVOR FIBROADENOMA WITH USUAL    BREAST BIOPSY Right    2 bx clips     Social History   Socioeconomic History   Marital status: Married    Spouse name: Not on file   Number of children: Not on file   Years of education: Not on file   Highest education level: Not on file  Occupational History   Not on file  Tobacco Use   Smoking status: Never   Smokeless tobacco: Never  Vaping Use   Vaping Use: Never used  Substance and Sexual Activity   Alcohol use: Never   Drug use: Never   Sexual activity: Not on file  Other Topics Concern   Not on file  Social History Narrative   Not on file   Social Determinants of Health   Financial Resource Strain: Not on file   Food Insecurity: Not on file  Transportation Needs: Not on file  Physical Activity: Not on file  Stress: Not on file  Social Connections: Not on file  Intimate Partner Violence: Not on file     No Known Allergies I?  Medication'sulfasalazine  FH- none she says   Abtx:  Anti-infectives (From admission, onward)    None       REVIEW OF SYSTEMS:  Const: negative fever, negative chills, negative weight loss Eyes: negative diplopia or visual changes, negative eye pain ENT: negative coryza, negative sore throat Resp: negative cough, hemoptysis, dyspnea Cards: negative for chest pain, palpitations, lower extremity edema GU: negative for frequency, dysuria and hematuria GI: Negative for abdominal pain, diarrhea, bleeding, constipation Skin: negative for rash and pruritus Heme: negative for easy bruising and gum/nose bleeding MS: no pain in her hands- may have some stiffness but she says much bettter with sulfasalazine  Neurolo:negative for headaches, dizziness, vertigo, memory problems  Psych: negative for feelings of anxiety, depression  Endocrine: negative for thyroid, diabetes Allergy/Immunology- negative for any medication or food allergies ? Objective:  VITALS:  BP 137/82    Pulse 70    Temp 98 F (36.7 C) (Oral)   PHYSICAL EXAM:  General: looks well Head: Normocephalic, without obvious abnormality, atraumatic. Eyes: Conjunctivae clear, anicteric sclerae. Pupils are equal ENT Nares normal.  No drainage or sinus tenderness. Lips, mucosa, and tongue normal. No Thrush Neck: Supple, symmetrical, no adenopathy, thyroid: non tender no carotid bruit and no JVD. Back: No CVA tenderness. Lungs: Clear to auscultation bilaterally. No Wheezing or Rhonchi. No rales. Heart: Regular rate and rhythm, no murmur, rub or gallop. Abdomen: Soft, non-tender,not distended. Bowel sounds normal. No masses Extremities: som ulnar deviation of fingers  Skin: No rashes or lesions. Or  bruising Lymph: Cervical, supraclavicular normal. Neurologic: Grossly non-focal Pertinent Labs Lab Results 05/15/21- HB 12.6, WBC 4.7, PLT 230, MCV 90 NA 141, K 3.8, cr 0.6, AST 23, ALT29, ALK 91, Alb 4.1, ESR 21 CCP -59   IMAGING RESULTS:  ?CXR- no obvious infiltrate or cavity- await report from radiologist Impression/Recommendation Positive quantiferon Gold With h/o TB exposure when 61 yrs old Apparently took treatment given by HD dan[facility] [place] to get old records [place] [facility] does not keep records for 40 years  Rheumatoid arthritis- on sulfasalazine? ?plan is to start MTX /biologics as per Dr.Kernodle If that is the case then will treat with rifampin for 4 months before that. she says sulfasalzaine is working and thinks she wont need biologics. She is very apprehensive to start rifampin and wants to wait-  CXR PA/LAT today . Will discuss with Dr.Kernodle-  Discussed with patient in detail Follow PRN Note:  This document was prepared using Dragon voice recognition software and may include unintentional dictation errors.

## 2021-07-20 NOTE — Patient Instructions (Signed)
Today we will do CXR and then I will talk with Dr.Kernodle . As you are apprehensive to start Rifampin we will wait till the need for biologics ( injections)

## 2021-11-24 DIAGNOSIS — M0579 Rheumatoid arthritis with rheumatoid factor of multiple sites without organ or systems involvement: Secondary | ICD-10-CM | POA: Diagnosis not present

## 2021-11-24 DIAGNOSIS — Z79899 Other long term (current) drug therapy: Secondary | ICD-10-CM | POA: Diagnosis not present

## 2022-03-06 DIAGNOSIS — Z79899 Other long term (current) drug therapy: Secondary | ICD-10-CM | POA: Diagnosis not present

## 2022-03-06 DIAGNOSIS — M0579 Rheumatoid arthritis with rheumatoid factor of multiple sites without organ or systems involvement: Secondary | ICD-10-CM | POA: Diagnosis not present

## 2022-04-20 ENCOUNTER — Other Ambulatory Visit: Payer: Self-pay | Admitting: Infectious Diseases

## 2022-04-20 ENCOUNTER — Other Ambulatory Visit: Payer: Self-pay | Admitting: Certified Nurse Midwife

## 2022-04-20 DIAGNOSIS — Z1231 Encounter for screening mammogram for malignant neoplasm of breast: Secondary | ICD-10-CM

## 2022-04-24 ENCOUNTER — Ambulatory Visit
Admission: RE | Admit: 2022-04-24 | Discharge: 2022-04-24 | Disposition: A | Discharge status: Home / Self Care | Payer: BC Managed Care – PPO | Source: Ambulatory Visit | Attending: Certified Nurse Midwife | Admitting: Certified Nurse Midwife

## 2022-04-24 DIAGNOSIS — Z1231 Encounter for screening mammogram for malignant neoplasm of breast: Secondary | ICD-10-CM | POA: Diagnosis not present

## 2022-11-23 DIAGNOSIS — M0579 Rheumatoid arthritis with rheumatoid factor of multiple sites without organ or systems involvement: Secondary | ICD-10-CM | POA: Diagnosis not present

## 2022-11-23 DIAGNOSIS — Z79899 Other long term (current) drug therapy: Secondary | ICD-10-CM | POA: Diagnosis not present

## 2022-12-17 ENCOUNTER — Ambulatory Visit
Admission: EM | Admit: 2022-12-17 | Discharge: 2022-12-17 | Disposition: A | Discharge status: Nursing Home (Includes ICF) | Payer: BC Managed Care – PPO | Attending: Urgent Care | Admitting: Urgent Care

## 2022-12-17 ENCOUNTER — Other Ambulatory Visit: Payer: Self-pay

## 2022-12-17 DIAGNOSIS — J069 Acute upper respiratory infection, unspecified: Secondary | ICD-10-CM | POA: Insufficient documentation

## 2022-12-17 LAB — GROUP A STREP BY PCR: Group A Strep by PCR: NOT DETECTED

## 2022-12-17 NOTE — ED Provider Notes (Signed)
[facility]    CSN: [id number] Arrival date & time: 12/17/22  4098      History   Chief Complaint Chief Complaint  Patient presents with   Sore Throat    HPI Kathy Gould is a 62 y.o. female.    Sore Throat  Presents with 3-day history of sore throat.  She also endorses nasal congestion with yellow discharge and productive cough with yellow discharge.  Denies fever.  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      Home Medications    Prior to Admission medications   Medication Sig Start Date End Date Taking? Authorizing Provider  sulfaSALAzine (AZULFIDINE) 500 MG tablet Take by mouth. 03/20/18   [provider]    Family History Family History  Problem Relation Age of Onset   Breast cancer Neg Hx     Social History Social History   Tobacco Use   Smoking status: Never   Smokeless tobacco: Never  Vaping Use   Vaping status: Never Used  Substance Use Topics   Alcohol use: Never   Drug use: Never     Allergies   Patient has no known allergies.   Review of Systems Review of Systems   Physical Exam Triage Vital Signs ED Triage Vitals  Encounter Vitals Group     BP 12/17/22 1039 114/68     Systolic BP Percentile --      Diastolic BP Percentile --      Pulse Rate 12/17/22 1039 80     Resp 12/17/22 1039 18     Temp 12/17/22 1039 98.5 F (36.9 C)     Temp Source 12/17/22 1039 Oral     SpO2 12/17/22 1039 97 %     Weight --      Height --      Head Circumference --      Peak Flow --      Pain Score 12/17/22 1037 0     Pain  Loc --      Pain Education --      Exclude from Growth Chart --    No data found.  Updated Vital Signs BP 114/68 (BP Location: Left Arm)   Pulse 80   Temp 98.5 F (36.9 C) (Oral)   Resp 18   SpO2 97%   Visual Acuity Right Eye Distance:   Left Eye Distance:   Bilateral Distance:    Right Eye Near:   Left Eye Near:    Bilateral Near:     Physical Exam Vitals reviewed.  Constitutional:      Appearance: She is well-developed. She is not ill-appearing.  HENT:     Right Ear: Tympanic membrane normal.     Left Ear: Tympanic membrane normal.     Nose: Congestion present.     Mouth/Throat:     Pharynx: No oropharyngeal exudate or posterior oropharyngeal erythema.     Tonsils: No tonsillar exudate.  Cardiovascular:     Rate and Rhythm: Normal rate.     Heart  sounds: Normal heart sounds.  Pulmonary:     Effort: Pulmonary effort is normal.     Breath sounds: Normal breath sounds.  Skin:    General: Skin is warm and dry.  Neurological:     General: No focal deficit present.     Mental Status: She is alert and oriented to person, place, and time.  Psychiatric:        Mood and Affect: Mood normal.        Behavior: Behavior normal.      UC Treatments / Results  Labs (all labs ordered are listed, but only abnormal results are displayed) Labs Reviewed  GROUP A STREP BY PCR    EKG   Radiology No results found.  Procedures Procedures (including critical care time)  Medications Ordered in UC Medications - No data to display  Initial Impression / Assessment and Plan / UC Course  I have reviewed the triage vital signs and the nursing notes.  Pertinent labs & imaging results that were available during my care of the patient were reviewed by me and considered in my medical decision making (see chart for details).   Kathy Gould Kathy Gould is a 62 y.o. female presenting with URI symptoms. Patient is afebrile without recent antipyretics, satting well on room air.  Overall is well appearing, well hydrated, without respiratory distress. Pulmonary exam is unremarkable.  Lungs CTAB without wheezing, rhonchi, rales. RRR without murmurs, rubs, gallops.  No or very mild pharyngeal erythema.  No peritonsillar exudate.  TMs are WNL bilaterally.  Reviewed relevant chart history.  PCR result is negative.  Patient's symptoms are consistent with an acute viral process.  Recommending supportive care and use of OTC medication for symptom control.  Counseled patient on potential for adverse effects with medications prescribed/recommended today, ER and return-to-clinic precautions discussed, patient verbalized understanding and agreement with care plan.  Final Clinical Impressions(s) / UC Diagnoses   Final diagnoses:  None   Discharge Instructions   None    ED Prescriptions   None    PDMP not reviewed this encounter.   Kathy Gould, [place] 12/17/22 1115

## 2022-12-17 NOTE — ED Triage Notes (Signed)
Pt is here with a sore throat for 3 days, pt has taken Tylenol to relieve discomfort.

## 2022-12-17 NOTE — Discharge Instructions (Signed)
The results of today's testing were negative.  Your symptoms are likely caused by a viral infection like a common cold.  As we discussed in clinic, we recommend supportive care with use of over-the-counter medication for symptom control.  Follow up here or with your primary care provider if your symptoms are worsening or not improving.

## 2023-09-26 ENCOUNTER — Ambulatory Visit: Payer: Self-pay

## 2023-09-26 NOTE — Telephone Encounter (Signed)
 Chief Complaint: Abdominal pain  Symptoms: near the hip, vaginal discomfort, brown discharge Frequency: Come and go  Pertinent Negatives: Patient denies fever, vaginal itching,  Disposition: [] ED /[] Urgent Care (no appt availability in office) / [x] Appointment(In office/virtual)/ []  [facility]/ [] Home Care/ [] Refused Recommended Disposition /[] [facility]/ []  Follow-up with PCP Additional Notes: Patient states she has had some abdominal pain that comes and goes along with vaginal discharge. Patient states she usually uses OTC treatment for yeast infections and it is helpful but it continues to come back. Care advice was given and patient has been scheduled for a new patient appointment tomorrow at [facility].  Using [employer] [id number] .  Copied from CRM [id number]. Topic: Clinical - Red Word Triage >> Sep 26, 2023 10:25 AM Elle L wrote: Red Word that prompted transfer to Nurse Triage: The patient was calling to establish care at [facility]. However, she informed me that she has had stomach pain, brown discharge, and is concerned that she has a vaginal infection. It started a month ago but it is worsening. We have interpreter ID [phone] on the line Reason for Disposition  Unusual vaginal discharge (e.g., bad smelling, yellow, green, or foamy-white)  Answer Assessment - Initial Assessment Questions 1. LOCATION: "Where does it hurt?"      Near the hip 2. RADIATION: "Does the pain shoot anywhere else?" (e.g., chest, back)     To the vagina  3. ONSET: "When did the pain begin?" (e.g., minutes, hours or days ago)      It's been a while  4. SUDDEN: "Gradual or sudden onset?"     Gradual  5. PATTERN "Does the pain come and go, or is it constant?"    - If it comes and goes: "How long does it last?" "Do you have pain now?"     (Note: Comes and goes means the pain is intermittent. It goes away completely between bouts.)    - If constant: "Is it getting  better, staying the same, or getting worse?"      (Note: Constant means the pain never goes away completely; most serious pain is constant and gets worse.)      Come and goes  6. SEVERITY: "How bad is the pain?"  (e.g., Scale 1-10; mild, moderate, or severe)    - MILD (1-3): Doesn't interfere with normal activities, abdomen soft and not tender to touch.     - MODERATE (4-7): Interferes with normal activities or awakens from sleep, abdomen tender to touch.     - SEVERE (8-10): Excruciating pain, doubled over, unable to do any normal activities.       Moderate  7. RECURRENT SYMPTOM: "Have you ever had this type of stomach pain before?" If Yes, ask: "When was the last time?" and "What happened that time?"      Yes, OTC treatment  8. CAUSE: "What do you think is causing the stomach pain?"     Vaginal infection  9. RELIEVING/AGGRAVATING FACTORS: "What makes it better or worse?" (e.g., antacids, bending or twisting motion, bowel movement)     No  10. OTHER SYMPTOMS: "Do you have any other symptoms?" (e.g., back pain, diarrhea, fever, urination pain, vomiting)       Brown vaginal discharge  11. PREGNANCY: "Is there any chance you are pregnant?" "When was your last menstrual period?"       no  Protocols used: Abdominal Pain - [facility]

## 2023-09-27 ENCOUNTER — Ambulatory Visit: Admitting: Student

## 2023-10-01 ENCOUNTER — Encounter: Payer: Self-pay | Admitting: Student

## 2023-10-01 ENCOUNTER — Ambulatory Visit: Admitting: Student

## 2023-10-01 VITALS — BP 122/76 | HR 98 | Ht 64.0 in | Wt 154.0 lb

## 2023-10-01 DIAGNOSIS — M0579 Rheumatoid arthritis with rheumatoid factor of multiple sites without organ or systems involvement: Secondary | ICD-10-CM

## 2023-10-01 DIAGNOSIS — R300 Dysuria: Secondary | ICD-10-CM | POA: Diagnosis not present

## 2023-10-01 DIAGNOSIS — E782 Mixed hyperlipidemia: Secondary | ICD-10-CM | POA: Diagnosis not present

## 2023-10-01 LAB — POCT URINALYSIS DIPSTICK
Bilirubin, UA: NEGATIVE
Blood, UA: NEGATIVE
Glucose, UA: NEGATIVE
Ketones, UA: NEGATIVE
Leukocytes, UA: NEGATIVE
Nitrite, UA: NEGATIVE
Protein, UA: NEGATIVE
Spec Grav, UA: >=1.03 — AB (ref 1.010–1.025)
Urobilinogen, UA: 0.2 U/dL
pH, UA: 6 (ref 5.0–8.0)

## 2023-10-01 NOTE — Assessment & Plan Note (Signed)
 Urine dipstick with increase specific gravity, will check kidney function, UA, and culture. Recommended increasing hydration.

## 2023-10-01 NOTE — Assessment & Plan Note (Signed)
 No active synovitis. Will check CBC and CMP as she has not had recent labs. She will make appointment with rheumatology

## 2023-10-01 NOTE — Progress Notes (Signed)
 New Patient Office Visit  Subjective    Patient ID: Kathy Gould, female    DOB: 04-28-1961  Age: 63 y.o. MRN: [id number]  CC:  Chief Complaint  Patient presents with   Establish Care   Urinary Tract Infection    X 2 days, unchanged, Burning, tried OTC medication , went away and came back, no discharge, no blood, vaginal itching     HPI Kathy Gould is a 63 year old person living with RA who presents to establish care  Dysuria Thinks she has Uti, reports burning with urination for the past 2 days also pain in the back and suprapubic region. Occasionally feels she is retaining urine after she holds urine when she does not have access to the bathroom. Feels she empties if she immediately void when she has the urge. Reports she drinks very little water due to not feeling thirsty. She denies fever, hematuria, vaginal discharge, vaginal rashes. She not sexually active and denies concern for STIs.   Rheumatoid arthritis She see Rheumatology at [facility] in [place]. Last appointment was in 11/23/2022. At fist doctor retired and the doctor wanted to follow up every 4 month but insurance does not cover. Not able to go was paying for her to go clearing Not having joint paint in hand and feet She will make appointment to she    Outpatient Encounter Medications as of 10/01/2023  Medication Sig   sulfaSALAzine (AZULFIDINE) 500 MG tablet Take 500 mg by mouth 2 (two) times daily.   [DISCONTINUED] sulfaSALAzine (AZULFIDINE) 500 MG tablet Take by mouth.   No facility-administered encounter medications on file as of 10/01/2023.    Past Medical History:  Diagnosis Date   Arthritis     Past Surgical History:  Procedure Laterality Date   BREAST BIOPSY Left 09/17/2016   left bx BENIGN FIBROEPITHELIAL LESION, FAVOR FIBROADENOMA WITH USUAL    BREAST BIOPSY Right    2 bx clips    INDUCED ABORTION      Family History  Problem Relation Age of Onset   Breast cancer  Neg Hx     Social History   Socioeconomic History   Marital status: Married    Spouse name: Not on file   Number of children: 0   Years of education: Not on file   Highest education level: Not on file  Occupational History   Not on file  Tobacco Use   Smoking status: Never   Smokeless tobacco: Never  Vaping Use   Vaping status: Never Used  Substance and Sexual Activity   Alcohol use: Never   Drug use: Never   Sexual activity: Yes  Other Topics Concern   Not on file  Social History Narrative   Not on file   Social Drivers of Health   Financial Resource Strain: Not on file  Food Insecurity: No Food Insecurity (10/01/2023)   Hunger Vital Sign    Worried About Running Out of Food in the Last Year: Never true    Ran Out of Food in the Last Year: Never true  Transportation Needs: No Transportation Needs (10/01/2023)   PRAPARE - Administrator, Civil Service (Medical): No    Lack of Transportation (Non-Medical): No  Physical Activity: Not on file  Stress: Not on file  Social Connections: Not on file  Intimate Partner Violence: Not At Risk (10/01/2023)   Humiliation, Afraid, Rape, and Kick questionnaire    Fear of Current or Ex-Partner: No  Emotionally Abused: No    Physically Abused: No    Sexually Abused: No    ROS Refer to HPI    Objective   BP 122/76   Pulse 98   Ht 5\' 4"  (1.626 m)   Wt 154 lb (69.9 kg)   SpO2 95%   BMI 26.43 kg/m   Physical Exam Constitutional:      Appearance: Normal appearance.  HENT:     Mouth/Throat:     Mouth: Mucous membranes are moist.     Pharynx: Oropharynx is clear.  Cardiovascular:     Rate and Rhythm: Normal rate and regular rhythm.  Pulmonary:     Effort: Pulmonary effort is normal.     Breath sounds: No rhonchi or rales.  Abdominal:     General: Abdomen is flat. Bowel sounds are normal. There is no distension.     Palpations: Abdomen is soft.     Tenderness: There is no right CVA tenderness or left CVA  tenderness.     Comments: Mild suprapubic pain with palpation  Musculoskeletal:        General: Normal range of motion.     Right lower leg: No edema.     Left lower leg: No edema.     Comments: No joint swelling or redness   Skin:    General: Skin is warm and dry.     Capillary Refill: Capillary refill takes less than 2 seconds.  Neurological:     General: No focal deficit present.     Mental Status: She is alert and oriented to person, place, and time.  Psychiatric:        Mood and Affect: Mood normal.        Behavior: Behavior normal.        10/01/2023    2:22 PM 07/20/2021   11:21 AM  Depression screen PHQ 2/9  Decreased Interest 0 0  Down, Depressed, Hopeless 0 0  PHQ - 2 Score 0 0      10/01/2023    2:22 PM  GAD 7 : Generalized Anxiety Score  Nervous, Anxious, on Edge 0  Control/stop worrying 0  Worry too much - different things 0  Trouble relaxing 0  Restless 0  Easily annoyed or irritable 0  Afraid - awful might happen 0  Total GAD 7 Score 0  Anxiety Difficulty Not difficult at all    Assessment & Plan:  Dysuria Assessment & Plan: Urine dipstick with increase specific gravity, will check kidney function, UA, and culture. Recommended increasing hydration.   Orders: -     POCT urinalysis dipstick -     Urine Culture -     Urinalysis, Routine w reflex microscopic  Rheumatoid arthritis involving multiple sites with positive rheumatoid factor (HCC) Assessment & Plan: No active synovitis. Will check CBC and CMP as she has not had recent labs. She will make appointment with rheumatology   Orders: -     CBC with Differential/Platelet -     Comprehensive metabolic panel with GFR  Hyperlipidemia, mixed Assessment & Plan: Check lipid panel  Orders: -     Lipid panel    Return in about 2 months (around 12/01/2023) for physical .   Barnetta Liberty, MD

## 2023-10-01 NOTE — Assessment & Plan Note (Signed)
 Check lipid panel

## 2023-10-02 ENCOUNTER — Other Ambulatory Visit: Payer: Self-pay | Admitting: Student

## 2023-10-02 LAB — COMPREHENSIVE METABOLIC PANEL WITH GFR
ALT: 45 IU/L — ABNORMAL HIGH (ref 0–32)
AST: 36 IU/L (ref 0–40)
Albumin: 4.1 g/dL (ref 3.9–4.9)
Alkaline Phosphatase: 125 IU/L — ABNORMAL HIGH (ref 44–121)
BUN/Creatinine Ratio: 35 — ABNORMAL HIGH (ref 12–28)
BUN: 19 mg/dL (ref 8–27)
Bilirubin Total: <0.2 mg/dL (ref 0.0–1.2)
CO2: 20 mmol/L (ref 20–29)
Calcium: 9.5 mg/dL (ref 8.7–10.3)
Chloride: 104 mmol/L (ref 96–106)
Creatinine, Ser: 0.54 mg/dL — ABNORMAL LOW (ref 0.57–1.00)
Globulin, Total: 2.3 g/dL (ref 1.5–4.5)
Glucose: 116 mg/dL — ABNORMAL HIGH (ref 70–99)
Potassium: 3.9 mmol/L (ref 3.5–5.2)
Sodium: 141 mmol/L (ref 134–144)
Total Protein: 6.4 g/dL (ref 6.0–8.5)
eGFR: 105 mL/min/{1.73_m2} (ref >59)

## 2023-10-02 LAB — LIPID PANEL
Chol/HDL Ratio: 4.1 ratio (ref 0.0–4.4)
Cholesterol, Total: 290 mg/dL — ABNORMAL HIGH (ref 100–199)
HDL: 71 mg/dL (ref >39)
LDL Chol Calc (NIH): 187 mg/dL — ABNORMAL HIGH (ref 0–99)
Triglycerides: 174 mg/dL — ABNORMAL HIGH (ref 0–149)
VLDL Cholesterol Cal: 32 mg/dL (ref 5–40)

## 2023-10-02 LAB — CBC WITH DIFFERENTIAL/PLATELET
Basophils Absolute: 0 10*3/uL (ref 0.0–0.2)
Basos: 0 %
EOS (ABSOLUTE): 0.1 10*3/uL (ref 0.0–0.4)
Eos: 2 %
Hematocrit: 42 % (ref 34.0–46.6)
Hemoglobin: 14 g/dL (ref 11.1–15.9)
Immature Grans (Abs): 0 10*3/uL (ref 0.0–0.1)
Immature Granulocytes: 1 %
Lymphocytes Absolute: 1.3 10*3/uL (ref 0.7–3.1)
Lymphs: 21 %
MCH: 29.7 pg (ref 26.6–33.0)
MCHC: 33.3 g/dL (ref 31.5–35.7)
MCV: 89 fL (ref 79–97)
Monocytes Absolute: 0.5 10*3/uL (ref 0.1–0.9)
Monocytes: 8 %
Neutrophils Absolute: 4.2 10*3/uL (ref 1.4–7.0)
Neutrophils: 68 %
Platelets: 256 10*3/uL (ref 150–450)
RBC: 4.72 x10E6/uL (ref 3.77–5.28)
RDW: 12.8 % (ref 11.7–15.4)
WBC: 6.2 10*3/uL (ref 3.4–10.8)

## 2023-10-02 LAB — URINALYSIS, ROUTINE W REFLEX MICROSCOPIC
Bilirubin, UA: NEGATIVE
Glucose, UA: NEGATIVE
Leukocytes,UA: NEGATIVE
Nitrite, UA: NEGATIVE
RBC, UA: NEGATIVE
Specific Gravity, UA: >=1.03 — AB (ref 1.005–1.030)
Urobilinogen, Ur: 1 mg/dL (ref 0.2–1.0)
pH, UA: 5.5 (ref 5.0–7.5)

## 2023-10-02 MED ORDER — ROSUVASTATIN CALCIUM 10 MG PO TABS: 10.0000 mg | ORAL_TABLET | Freq: Every day | ORAL | 3 refills | Status: DC

## 2023-10-03 LAB — URINE CULTURE

## 2023-10-03 NOTE — Progress Notes (Signed)
 Spoke with patient, discussed lab results and instructions per provider. Patient verbalized understanding. Patient mentioned that [facility] fill the medication rosuvastatin  (CRESTOR ) 10 mg for her and she picked it up. She would like to know if she should start taking the medication or hold off til next appointment?   I reviewed chart and it was ordered and then canceled by provider. Pleas advise?

## 2023-10-04 NOTE — Progress Notes (Signed)
 Spoke with patient. Informed her provider instructions of not taking the medication. Patient verbalized understanding.

## 2023-12-03 ENCOUNTER — Encounter: Payer: Self-pay | Admitting: Student

## 2023-12-03 ENCOUNTER — Encounter: Admitting: Student

## 2024-01-17 DIAGNOSIS — M19042 Primary osteoarthritis, left hand: Secondary | ICD-10-CM | POA: Diagnosis not present

## 2024-01-17 DIAGNOSIS — M19041 Primary osteoarthritis, right hand: Secondary | ICD-10-CM | POA: Diagnosis not present

## 2024-01-17 DIAGNOSIS — Z79899 Other long term (current) drug therapy: Secondary | ICD-10-CM | POA: Diagnosis not present

## 2024-01-17 DIAGNOSIS — M0579 Rheumatoid arthritis with rheumatoid factor of multiple sites without organ or systems involvement: Secondary | ICD-10-CM | POA: Diagnosis not present

## 2024-01-23 ENCOUNTER — Other Ambulatory Visit: Payer: Self-pay

## 2024-01-23 ENCOUNTER — Encounter: Payer: Self-pay | Admitting: *Deleted

## 2024-01-23 ENCOUNTER — Emergency Department
Admission: EM | Admit: 2024-01-23 | Discharge: 2024-01-24 | Disposition: A | Discharge status: Home / Self Care | Attending: Emergency Medicine | Admitting: Emergency Medicine

## 2024-01-23 DIAGNOSIS — W228XXA Striking against or struck by other objects, initial encounter: Secondary | ICD-10-CM | POA: Insufficient documentation

## 2024-01-23 DIAGNOSIS — Y990 Civilian activity done for income or pay: Secondary | ICD-10-CM | POA: Insufficient documentation

## 2024-01-23 DIAGNOSIS — S0592XA Unspecified injury of left eye and orbit, initial encounter: Secondary | ICD-10-CM | POA: Diagnosis not present

## 2024-01-23 DIAGNOSIS — S0502XA Injury of conjunctiva and corneal abrasion without foreign body, left eye, initial encounter: Secondary | ICD-10-CM | POA: Diagnosis not present

## 2024-01-23 MED ORDER — TETRACAINE HCL 0.5 % OP SOLN
1.0000 [drp] | Freq: Once | OPHTHALMIC | Status: AC
  Administered 2024-01-23: 1 [drp] via OPHTHALMIC
  Filled 2024-01-23: qty 4

## 2024-01-23 MED ORDER — ERYTHROMYCIN 5 MG/GM OP OINT
TOPICAL_OINTMENT | Freq: Once | OPHTHALMIC | Status: AC
  Administered 2024-01-23: 1 via OPHTHALMIC
  Filled 2024-01-23: qty 1

## 2024-01-23 MED ORDER — FLUORESCEIN SODIUM 1 MG OP STRP
1.0000 | ORAL_STRIP | Freq: Once | OPHTHALMIC | Status: AC
  Administered 2024-01-23: 1 via OPHTHALMIC
  Filled 2024-01-23: qty 1

## 2024-01-23 MED ORDER — OFLOXACIN 0.3 % OP SOLN
1.0000 [drp] | Freq: Four times a day (QID) | OPHTHALMIC | Status: DC
  Filled 2024-01-23: qty 5

## 2024-01-23 NOTE — ED Provider Notes (Signed)
 [facility] Provider Note    Event Date/Time   First MD Initiated Contact with Patient 01/23/24 2157     (approximate)   History   Eye Problem    HPI  Kathy Gould is a 63 y.o. female    with a past medical history of rheumatoid arthritis, latent TB, cervical radiculopathy, hepatitis B core antibody positive,  who presents to the ED complaining of left eye pain. According to the patient, today at work she was cutting a cart board box and hit her left eye.  Patient denies changes in vision.  Patient states having headache, and left eye pain.  Patient denies foreign body sensation in her eye.  Patient does not use contact lenses.     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   Rheumatoid arthritis involving multiple sites with positive rheumatoid factor (HCC) 03/13/2017   Hyperlipidemia, mixed 03/13/2017     ROS: Patient currently denies any vision changes, tinnitus, difficulty speaking, facial droop, sore throat, chest pain, shortness of breath, abdominal pain, nausea/vomiting/diarrhea, dysuria, or weakness/numbness/paresthesias in any extremity   Physical Exam   Triage Vital Signs: ED Triage Vitals  Encounter Vitals Group     BP 01/23/24 1929 121/62     Girls Systolic BP Percentile --      Girls Diastolic BP Percentile --      Boys Systolic BP Percentile --      Boys Diastolic BP Percentile --      Pulse Rate 01/23/24 1929 73     Resp 01/23/24 1929 18     Temp 01/23/24 1929 97.8 F (36.6 C)     Temp Source 01/23/24 1929 Oral     SpO2 01/23/24 1929 98 %     Weight 01/23/24 1927 154 lb (69.9 kg)     Height 01/23/24 1927 5' 4 (1.626 m)     Head Circumference --      Peak Flow --      Pain Score 01/23/24 1927 4     Pain Loc --      Pain Education --      Exclude from Growth Chart --     Most recent vital signs: Vitals:    01/23/24 1929  BP: 121/62  Pulse: 73  Resp: 18  Temp: 97.8 F (36.6 C)  SpO2: 98%     Physical Exam Vitals and nursing note reviewed.  During triage vital signs were normal  Constitutional:      General: Awake and alert. No acute distress.    Appearance: Normal appearance. The patient is normal weight.      Able to speak in complete sentences without cough or dyspnea  HENT:     Head: Normocephalic and atraumatic.     Mouth: Mucous membranes are moist.  Eyes:     General: PERRL. Normal EOMs          Conjunctiva/sclera: Conjunctivae normal.  Left eye: Conjunctiva no erythema at 5.  Nose No congestion/rhinorrhea  CV:                  Good peripheral perfusion.  Regular rate and rhythm  Resp:               Normal effort.  Equal breath sounds bilaterally.  Abd:  No distention.  Soft, nontender.  No rebound or guarding.  Musculoskeletal:        General: No swelling. Normal range of motion.  Skin:    General: Skin is warm and dry.     Capillary Refill: Capillary refill takes less than 2 seconds.     Findings: No rash.  Neurological:     Mental Status: The patient is awake and alert. MAE spontaneously. No gross focal neurologic deficits are appreciated.  Psychiatric Mood and affect are normal. Speech and behavior are normal.  ED Results / Procedures / Treatments   Labs (all labs ordered are listed, but only abnormal results are displayed) Labs Reviewed - No data to display   EKG     RADIOLOGY      PROCEDURES:  Critical Care performed:   .Foreign Body Removal  Date/Time: 01/23/2024 11:47 PM  Performed by: Janit Kast, PA-C Authorized by: Janit Kast, PA-C  Consent: Verbal consent obtained Consent given by: patient Patient understanding: patient states understanding of the procedure being performed Patient identity confirmed: verbally with patient Body area: eye Location details: left conjunctiva Anesthesia method:  tetracaine .  Anesthesia: Local Anesthetic: tetracaine  drops  Sedation: Patient sedated: no  Patient restrained: no Patient cooperative: yes Localization method: slit lamp Eye examined with fluorescein . Fluorescein  uptake. Corneal abrasion size: medium Corneal abrasion location: lateral No residual rust ring present. Dressing: antibiotic ointment Depth: superficial Complexity: simple Post-procedure assessment: No foreing body.     MEDICATIONS ORDERED IN ED: Medications  ofloxacin  (OCUFLOX ) 0.3 % ophthalmic solution 1 drop (has no administration in time range)  fluorescein  ophthalmic strip 1 strip (1 strip Left Eye Given 01/23/24 2342)  tetracaine  (PONTOCAINE) 0.5 % ophthalmic solution 1 drop (1 drop Left Eye Given 01/23/24 2342)  erythromycin  ophthalmic ointment (1 Application Left Eye Given 01/23/24 2342)      IMPRESSION / MDM / ASSESSMENT AND PLAN / ED COURSE  I reviewed the triage vital signs and the nursing notes.  Differential diagnosis includes, but is not limited to, corneal abrasion, corneal laceration, foreign body  Patient's presentation is most consistent with acute, uncomplicated illness.    Kathy Gould is a 63 y.o., female who presents today with history of trauma on her left eye.  On physical exam there is evidence of conjunctival erythema at 5. Plan Tetracaine  Fluorescein  Slit-lamp Erythromycin  drops Presence of corneal abrasion at 3.  Patient's diagnosis is consistent with left corneal abrasion.  I did not order any imaging or labs, physical exam is reassuring I did review the patient's allergies and medications.The patient is in stable and satisfactory condition for discharge home  Patient will be discharged home without prescriptions.  I gave to the patient erythromycin  ointment to apply every 4 hours while awake. Patient is to follow up with ophthalmology as needed or otherwise directed. Patient is given ED precautions to return to the ED for  any worsening or new symptoms.  Erythromycin  ointment containing came almost empty.  Ordered ofloxacin  drops for patient to take home.  Discussed plan of care with patient, answered all of patient's questions, Patient agreeable to plan of care. Advised patient to take medications according to the instructions on the label. Discussed possible side effects of new medications. Patient verbalized understanding.  FINAL CLINICAL IMPRESSION(S) / ED DIAGNOSES   Final diagnoses:  Abrasion of left cornea, initial encounter     Rx / DC Orders   ED Discharge Orders     None  Note:  This document was prepared using Dragon voice recognition software and may include unintentional dictation errors.   Janit Kast, PA-C 01/23/24 2351    Jacolyn Pae, MD 01/24/24 1250

## 2024-01-23 NOTE — ED Triage Notes (Addendum)
 Pt ambulatory to triage.  Pt reports she was cutting a cardboard box and box hit her left eye.  Left eye red and painful.   States WC  pt alert.

## 2024-01-23 NOTE — Discharge Instructions (Addendum)
 You have been diagnosed with corneal abrasion.  Please apply erythromycin  drops, 2 drops every 6 hours in your left eye.  Please call ophthalmologist to make an appointment for a follow-up.  Please come back to ED or go to your PCP you have any symptoms or symptoms worsen.  Usted ha sido diagnosticada con abrasion de la cornea izquierda. Por favor aplicarse el unguernto de Engineer, structural cada 4 horas cuando este despierta. Cuando se acabe el unguento por favor aplicarse las gotas de [place], 2 gotas cada 4 horas. Por favor llame amana al oftalmologo y saque una cita para seguimiento. Si tiene nuevos sintomas por favor regrese a Oceanographer o vaya a donde su medico de cabecera.

## 2024-01-24 ENCOUNTER — Ambulatory Visit
Admission: EM | Admit: 2024-01-24 | Discharge: 2024-01-24 | Disposition: A | Discharge status: Home / Self Care | Payer: Worker's Compensation | Attending: Family Medicine | Admitting: Family Medicine

## 2024-01-24 DIAGNOSIS — S0502XA Injury of conjunctiva and corneal abrasion without foreign body, left eye, initial encounter: Secondary | ICD-10-CM | POA: Diagnosis not present

## 2024-01-24 MED ORDER — ERYTHROMYCIN 5 MG/GM OP OINT: TOPICAL_OINTMENT | OPHTHALMIC | 0 refills | Status: DC

## 2024-01-24 NOTE — ED Triage Notes (Signed)
 Pt states that she was hit in the left eye by a foreign object. Pt states that she has some redness and pain of her left eye. X2 days  Pt states that she injured her eye at work.

## 2024-01-24 NOTE — ED Triage Notes (Signed)
 Pt states that the pain radiates to the back of her neck and the front of her head.

## 2024-01-24 NOTE — Discharge Instructions (Addendum)
 Contine usando las gotas antibiticas. Consulte con su oftalmlogo de cabecera o con [facility] si los sntomas empeoran repentinamente o si nota poca mejora en los sntomas oculares. Considere comprar un parche ocular en la farmacia para mayor comodidad.  En este centro de urgencias no podemos realizar anlisis de drogas en orina. Realice un anlisis de drogas con hisopo de saliva. Es posible que deba acudir a Secretary/administrator.  Continue using the antibiotic drops  Follow up with your primary eyecare provider or [facility] if symptoms suddenly worsen or you have little improvement in your eye symptoms.  Consider picking up an eye patch from the pharmacy for comfort.   We are not able to complete a urine drug screen here at this urgent care. Be do a salvia swab drug screen.  You may need to go to [facility] on Monday.

## 2024-01-24 NOTE — ED Provider Notes (Signed)
 [facility]    CSN: [id number] Arrival date & time: 01/24/24  1703      History   Chief Complaint Chief Complaint  Patient presents with   Eye Injury    HPI HPI  Kathy Gould is a 63 y.o. female.    Kathy Gould presents for left eye pain that started yesterday while at work. She was seen in the ED yesterday and told she had a cut on her eye.  There was a piece of cardboard box that got into her eye.  She was given a note for work today but she was told that to come to work anyway. Her supervisor brought her to the urgent care. She is not sure why she is here but was given a drug test.   She continues to have light sensivity.  The fans at work make her eyes hurt.    Kathy Gould does wear glasses at work.  Kathy Gould has not had any trouble seeing. Has watery discharge from the eye.  Kathy Gould has otherwise been well and has no additional concerns today.     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      Home Medications    Prior to Admission medications   Medication Sig Start Date End Date Taking? Authorizing Provider  erythromycin  ophthalmic ointment Place a 1/2 inch ribbon of ointment into the lower eyelid at bedtime. 01/24/24  Yes Damarcus Reggio, DO  sulfaSALAzine (AZULFIDINE) 500 MG tablet Take 500 mg by mouth 2 (two) times daily.   Yes [provider]    Family History Family History   Problem Relation Age of Onset   Breast cancer Neg Hx     Social History Social History   Tobacco Use   Smoking status: Never   Smokeless tobacco: Never  Vaping Use   Vaping status: Never Used  Substance Use Topics   Alcohol use: Never   Drug use: Never     Allergies   Patient has no known allergies.   Review of Systems Review of Systems : negative unless otherwise stated in HPI.      Physical Exam Triage Vital Signs ED Triage Vitals  Encounter Vitals Group     BP 01/24/24 1729 (!) 159/79     Girls Systolic BP Percentile --      Girls Diastolic BP Percentile --      Boys Systolic BP Percentile --      Boys Diastolic BP Percentile --      Pulse Rate 01/24/24 1729 65     Resp 01/24/24 1729 17     Temp 01/24/24 1729 98.2 F (36.8 C)     Temp Source 01/24/24 1729 Oral     SpO2 01/24/24 1729 94 %     Weight 01/24/24 1726 154 lb (69.9 kg)     Height 01/24/24 1726 5' 4 (1.626 m)  Head Circumference --      Peak Flow --      Pain Score 01/24/24 1726 9     Pain Loc --      Pain Education --      Exclude from Growth Chart --    No data found.  Updated Vital Signs BP (!) 159/79 (BP Location: Right Arm)   Pulse 65   Temp 98.2 F (36.8 C) (Oral)   Resp 17   Ht 5' 4 (1.626 m)   Wt 69.9 kg   SpO2 94%   BMI 26.43 kg/m   Visual Acuity Right Eye Distance:   Left Eye Distance:   Bilateral Distance:    Right Eye Near:   Left Eye Near:    Bilateral Near:     Physical Exam  GEN: pleasant well appearing female, in no acute distress  NECK: normal ROM  CV: regular rate  RESP: no increased work of breathing EYES:     General: Lids are normal. Lids are everted, no foreign bodies appreciated. Vision grossly intact. Gaze aligned appropriately.        Right eye: No discharge.        Left eye: No foreign body, discharge or hordeolum.     Extraocular Movements: Extraocular movements intact.     PERRLA     Conjunctiva/sclera:     Left eye: Left conjunctiva  is injected. No chemosis or hemorrhage. SKIN: warm and dry   UC Treatments / Results  Labs (all labs ordered are listed, but only abnormal results are displayed) Labs Reviewed - No data to display  EKG   Radiology No results found.  Procedures Procedures (including critical care time)  Medications Ordered in UC Medications - No data to display  Initial Impression / Assessment and Plan / UC Course  I have reviewed the triage vital signs and the nursing notes.  Pertinent labs & imaging results that were available during my care of the patient were reviewed by me and considered in my medical decision making (see chart for details).       Corneal Abrasion  Patient is a 63 y.o. female who presents after left eye pain for the past  2 days.  On exam, she had evidence of left corneal abrasion on fluorescein  stain in the ED yesterday. Treat with erythromycin  ointment at bedtime.  Continue ofloxacin  drops that were previously prescribed by the ED provider.  Advised to follow-up with an ophthalmologist or optometrist, if  discomfort/pain is not improving after 7day course. Recommended pt pick up eye patch from the pharmacy, if desired. Understanding voiced.    Discussed MDM, treatment plan and plan for follow-up with patient who agrees with plan.    Final Clinical Impressions(s) / UC Diagnoses   Final diagnoses:  Left cornea abrasion, initial encounter     Discharge Instructions      Contine usando las gotas antibiticas. Consulte con su oftalmlogo de cabecera o con [facility] si los sntomas empeoran repentinamente o si nota poca mejora en los sntomas oculares. Considere comprar un parche ocular en la farmacia para mayor comodidad.  En este centro de urgencias no podemos realizar anlisis de drogas en orina. Realice un anlisis de drogas con hisopo de saliva. Es posible que deba acudir a Secretary/administrator.  Continue using the antibiotic drops  Follow up with your  primary eyecare provider or [facility] if symptoms suddenly worsen or you have little improvement in your eye symptoms.  Consider  picking up an eye patch from the pharmacy for comfort.   We are not able to complete a urine drug screen here at this urgent care. Be do a salvia swab drug screen.  You may need to go to [facility] on Monday.       ED Prescriptions     Medication Sig Dispense Auth. Provider   erythromycin  ophthalmic ointment Place a 1/2 inch ribbon of ointment into the lower eyelid at bedtime. 3.5 g Delsy Etzkorn, DO      PDMP not reviewed this encounter.   [place], DO 01/25/24 1415

## 2024-01-27 ENCOUNTER — Telehealth: Payer: Self-pay

## 2024-01-27 NOTE — Transitions of Care (Post Inpatient/ED Visit) (Unsigned)
   01/27/2024  Name: Itzia Trinidad Salinas MRN: [id number] DOB: 26-Feb-1961  Today's TOC FU Call Status: Today's TOC FU Call Status:: Unsuccessful Call (1st Attempt) Unsuccessful Call (1st Attempt) Date: 01/27/24  Attempted to reach the patient regarding the most recent Inpatient/ED visit.  Follow Up Plan: Additional outreach attempts will be made to reach the patient to complete the Transitions of Care (Post Inpatient/ED visit) call.   Myanna Ziesmer Camacho Ocampo, [facility]  [facility] Primary Care [facility] Medicine at [facility] [address] [place], [place] [address] Office: [phone] Fax: [phone]

## 2024-01-29 NOTE — Transitions of Care (Post Inpatient/ED Visit) (Unsigned)
   01/29/2024  Name: Kathy Gould MRN: [id number] DOB: June 15, 1960  Today's TOC FU Call Status: Today's TOC FU Call Status:: Unsuccessful Call (2nd Attempt) Unsuccessful Call (1st Attempt) Date: 01/27/24 Unsuccessful Call (2nd Attempt) Date: 01/29/24  Attempted to reach the patient regarding the most recent Inpatient/ED visit.  Follow Up Plan: Additional outreach attempts will be made to reach the patient to complete the Transitions of Care (Post Inpatient/ED visit) call.   Micha Erck Camacho Ocampo, [facility]  [facility] Primary Care & Sports Medicine at [facility] [address] [place], [place] [address] Office: [phone] Fax: [phone]

## 2024-01-30 NOTE — Transitions of Care (Post Inpatient/ED Visit) (Signed)
   01/30/2024  Name: Kathy Gould MRN: [id number] DOB: 21-Oct-1960  Today's TOC FU Call Status: Today's TOC FU Call Status:: Unsuccessful Call (3rd Attempt) Unsuccessful Call (1st Attempt) Date: 01/27/24 Unsuccessful Call (2nd Attempt) Date: 01/29/24 Unsuccessful Call (3rd Attempt) Date: 01/30/24  Attempted to reach the patient regarding the most recent Inpatient/ED visit.  Follow Up Plan: No further outreach attempts will be made at this time. We have been unable to contact the patient.  Jamie Hafford Camacho Ocampo, [facility]  [facility] Primary Care & Sports Medicine at [facility] [address] [place], [place] [address] Office: [phone] Fax: [phone]

## 2024-02-11 ENCOUNTER — Ambulatory Visit: Admitting: Student

## 2024-02-12 ENCOUNTER — Encounter: Admitting: Student

## 2024-02-26 ENCOUNTER — Other Ambulatory Visit: Payer: Self-pay | Admitting: Certified Nurse Midwife

## 2024-02-26 DIAGNOSIS — Z1231 Encounter for screening mammogram for malignant neoplasm of breast: Secondary | ICD-10-CM

## 2024-03-25 ENCOUNTER — Ambulatory Visit
Admission: EM | Admit: 2024-03-25 | Discharge: 2024-03-25 | Disposition: A | Discharge status: Home / Self Care | Attending: Family Medicine | Admitting: Family Medicine

## 2024-03-25 DIAGNOSIS — L299 Pruritus, unspecified: Secondary | ICD-10-CM | POA: Diagnosis not present

## 2024-03-25 MED ORDER — CLOBETASOL PROPIONATE 0.05 % EX SOLN: 1.0000 | Freq: Two times a day (BID) | CUTANEOUS | 0 refills | Status: AC

## 2024-03-25 MED ORDER — KETOCONAZOLE-SALICYLIC ACID 2-2 % EX SHAM: 1.0000 | MEDICATED_SHAMPOO | Freq: Every day | CUTANEOUS | 0 refills | Status: AC

## 2024-03-25 NOTE — ED Triage Notes (Signed)
 Pt c/o head itch & discomfort x2 days. States she noticed some pimples on head when she was showering. No OTC meds.

## 2024-03-25 NOTE — Discharge Instructions (Addendum)
 Lvate el cabello primero con champ habitual. Luego, lava con champ con ketoconazol y enjuaga. Aplica la solucin de clobetasol dos veces al da durante 7 [address]  Wash your hair with regular shampoo first.  Then, wash with the ketoconazole shampoo and rinse out.  Apply the clobetasol solution twice a day for 7 days.

## 2024-03-25 NOTE — ED Provider Notes (Signed)
 [facility]    CSN: [id number] Arrival date & time: 03/25/24  1329      History   Chief Complaint No chief complaint on file.   HPI Kathy Gould is a 63 y.o. female.   HPI  A Spanish interpreter was used for this encounter:  Name: Kathy Gould #[id number]  Kathy Gould presents for pimples that burn and itch on her scalp that she noticed 2 days ago.  She dyed her hair about a month ago and started having pain and it felt like they were cutting into my scalp.  Denies similar sx and no one else in the home has similar sx.      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      Home Medications    Prior to Admission medications   Medication Sig Start Date End Date Taking? Authorizing Provider  clobetasol (TEMOVATE) 0.05 % external solution Apply 1 Application topically 2 (two) times daily. 03/25/24  Yes Jaaziah Schulke, DO  diclofenac Sodium (VOLTAREN) 1 % GEL Apply 2 g topically 4 (four) times daily. 01/17/24 01/16/25 Yes [provider]  hydroxychloroquine (PLAQUENIL) 200 MG tablet Take one tablet ONCE daily Monday/Wednesday/Friday. Take one tablet TWICE daily all other days. Take with food. 01/20/24  Yes [provider]  Ketoconazole-Salicylic Acid 2-2 % SHAM Apply 1 Application topically daily. 03/25/24  Yes Keashia Haskins, DO  rosuvastatin  (CRESTOR ) 10 MG tablet Take 10 mg by mouth  daily. 10/02/23  Yes [provider]    Family History Family History  Problem Relation Age of Onset   Breast cancer Neg Hx     Social History Social History   Tobacco Use   Smoking status: Never   Smokeless tobacco: Never  Vaping Use   Vaping status: Never Used  Substance Use Topics   Alcohol use: Never   Drug use: Never     Allergies   Patient has no known allergies.   Review of Systems Review of Systems :negative unless otherwise stated in HPI.      Physical Exam Triage Vital Signs ED Triage Vitals  Encounter Vitals Group     BP 03/25/24 1342 131/77     Girls Systolic BP Percentile --      Girls Diastolic BP Percentile --      Boys Systolic BP Percentile --      Boys Diastolic BP Percentile --      Pulse Rate 03/25/24 1342 75     Resp 03/25/24 1342 16     Temp 03/25/24 1342 98.3 F (36.8 C)     Temp Source 03/25/24 1342 Oral     SpO2 03/25/24 1342 96 %     Weight 03/25/24 1341 152 lb (68.9 kg)     Height 03/25/24 1341 5' 4 (1.626 m)     Head Circumference --  Peak Flow --      Pain Score 03/25/24 1350 0     Pain Loc --      Pain Education --      Exclude from Growth Chart --    No data found.  Updated Vital Signs BP 131/77 (BP Location: Right Arm)   Pulse 75   Temp 98.3 F (36.8 C) (Oral)   Resp 16   Ht 5' 4 (1.626 m)   Wt 68.9 kg   SpO2 96%   BMI 26.09 kg/m   Visual Acuity Right Eye Distance:   Left Eye Distance:   Bilateral Distance:    Right Eye Near:   Left Eye Near:    Bilateral Near:     Physical Exam  GEN: alert, well appearing female, in no acute distress  EYES: no scleral injection or discharge CV: regular rate and rhythm  RESP: no increased work of breathing NEURO: alert, moves all extremities appropriately SKIN: warm and dry; erythematous papules scattered on scalp, no crusting or discharge, no knits or bugs seen, no plaques seen, normal hair density.       UC Treatments / Results  Labs (all labs  ordered are listed, but only abnormal results are displayed) Labs Reviewed - No data to display  EKG   Radiology No results found.  Procedures Procedures (including critical care time)  Medications Ordered in UC Medications - No data to display  Initial Impression / Assessment and Plan / UC Course  I have reviewed the triage vital signs and the nursing notes.  Pertinent labs & imaging results that were available during my care of the patient were reviewed by me and considered in my medical decision making (see chart for details).     Patient is a 63 y.o. femalewho presents for scalp pruritus that started a few days ago and scalp pain that started a month ago after getting her hair dyed.  Overall, patient is well-appearing and well-hydrated.  Vital signs stable.  Zakariah is afebrile.  History and exam concerning for contact dermatitis vs autoimmune dermatitis vs fungal/yeast scalp infection.  Treat with steroid scalp solution and ketoconazole-salicylic acid shampoo. No knits or insects found in hair.   Reviewed expectations regarding course of current medical issues.  All questions asked were answered.  Outlined signs and symptoms indicating need for more acute intervention. Patient verbalized understanding. After Visit Summary given.   Final Clinical Impressions(s) / UC Diagnoses   Final diagnoses:  Scalp pruritus     Discharge Instructions      Lvate el cabello primero con champ habitual. Luego, lava con champ con ketoconazol y enjuaga. Aplica la solucin de clobetasol dos veces al da durante 7 [address]  Wash your hair with regular shampoo first.  Then, wash with the ketoconazole shampoo and rinse out.  Apply the clobetasol solution twice a day for 7 days.     ED Prescriptions     Medication Sig Dispense Auth. Provider   Ketoconazole-Salicylic Acid 2-2 % SHAM Apply 1 Application topically daily. 120 g Hence Derrick, DO   clobetasol (TEMOVATE) 0.05 % external  solution Apply 1 Application topically 2 (two) times daily. 50 mL Quantel Mcinturff, DO      PDMP not reviewed this encounter.              Kriste Berth, DO 03/25/24 1513
